# Patient Record
Sex: Female | Born: 1970 | Race: Black or African American | Hispanic: No | Marital: Single | Smoking: Current some day smoker
Health system: Southern US, Community
[De-identification: ages and names within clinical notes are randomized; demographics above are authoritative.]

## PROBLEM LIST (undated history)

## (undated) DIAGNOSIS — R51 Headache: Secondary | ICD-10-CM

## (undated) DIAGNOSIS — G4733 Obstructive sleep apnea (adult) (pediatric): Secondary | ICD-10-CM

## (undated) DIAGNOSIS — I10 Essential (primary) hypertension: Secondary | ICD-10-CM

## (undated) DIAGNOSIS — J45909 Unspecified asthma, uncomplicated: Secondary | ICD-10-CM

## (undated) DIAGNOSIS — M5430 Sciatica, unspecified side: Secondary | ICD-10-CM

## (undated) DIAGNOSIS — Z87442 Personal history of urinary calculi: Secondary | ICD-10-CM

## (undated) DIAGNOSIS — F419 Anxiety disorder, unspecified: Secondary | ICD-10-CM

## (undated) DIAGNOSIS — Z8679 Personal history of other diseases of the circulatory system: Secondary | ICD-10-CM

## (undated) DIAGNOSIS — E559 Vitamin D deficiency, unspecified: Secondary | ICD-10-CM

## (undated) DIAGNOSIS — Z8619 Personal history of other infectious and parasitic diseases: Secondary | ICD-10-CM

## (undated) DIAGNOSIS — E119 Type 2 diabetes mellitus without complications: Secondary | ICD-10-CM

## (undated) DIAGNOSIS — R011 Cardiac murmur, unspecified: Secondary | ICD-10-CM

## (undated) DIAGNOSIS — F329 Major depressive disorder, single episode, unspecified: Secondary | ICD-10-CM

## (undated) DIAGNOSIS — E785 Hyperlipidemia, unspecified: Secondary | ICD-10-CM

## (undated) DIAGNOSIS — F319 Bipolar disorder, unspecified: Secondary | ICD-10-CM

## (undated) DIAGNOSIS — F32A Depression, unspecified: Secondary | ICD-10-CM

## (undated) DIAGNOSIS — G43909 Migraine, unspecified, not intractable, without status migrainosus: Secondary | ICD-10-CM

## (undated) DIAGNOSIS — M1990 Unspecified osteoarthritis, unspecified site: Secondary | ICD-10-CM

## (undated) HISTORY — DX: Personal history of other diseases of the circulatory system: Z86.79

## (undated) HISTORY — DX: Sciatica, unspecified side: M54.30

## (undated) HISTORY — DX: Migraine, unspecified, not intractable, without status migrainosus: G43.909

## (undated) HISTORY — DX: Unspecified osteoarthritis, unspecified site: M19.90

## (undated) HISTORY — DX: Cardiac murmur, unspecified: R01.1

## (undated) HISTORY — DX: Type 2 diabetes mellitus without complications: E11.9

## (undated) HISTORY — DX: Obstructive sleep apnea (adult) (pediatric): G47.33

## (undated) HISTORY — DX: Anxiety disorder, unspecified: F41.9

## (undated) HISTORY — DX: Personal history of other infectious and parasitic diseases: Z86.19

## (undated) HISTORY — DX: Hyperlipidemia, unspecified: E78.5

## (undated) HISTORY — DX: Bipolar disorder, unspecified: F31.9

## (undated) HISTORY — DX: Headache: R51

## (undated) HISTORY — DX: Major depressive disorder, single episode, unspecified: F32.9

## (undated) HISTORY — DX: Personal history of urinary calculi: Z87.442

## (undated) HISTORY — DX: Unspecified asthma, uncomplicated: J45.909

## (undated) HISTORY — DX: Depression, unspecified: F32.A

## (undated) HISTORY — DX: Essential (primary) hypertension: I10

## (undated) HISTORY — DX: Vitamin D deficiency, unspecified: E55.9

---
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
## 1995-07-28 HISTORY — PX: TUBAL LIGATION: SHX77

## 2004-07-27 DIAGNOSIS — E119 Type 2 diabetes mellitus without complications: Secondary | ICD-10-CM

## 2004-07-27 HISTORY — DX: Type 2 diabetes mellitus without complications: E11.9

## 2005-07-27 DIAGNOSIS — Z87442 Personal history of urinary calculi: Secondary | ICD-10-CM

## 2005-07-27 HISTORY — DX: Personal history of urinary calculi: Z87.442

## 2006-07-04 ENCOUNTER — Encounter: Payer: Self-pay | Admitting: Internal Medicine

## 2007-07-28 HISTORY — PX: KNEE SURGERY: SHX244

## 2008-07-27 HISTORY — PX: OTHER SURGICAL HISTORY: SHX169

## 2011-06-08 LAB — LIPID PANEL: HDL: 51 mg/dL (ref 35–70)

## 2011-11-19 ENCOUNTER — Encounter: Payer: Self-pay | Admitting: Family Medicine

## 2011-11-19 ENCOUNTER — Ambulatory Visit (INDEPENDENT_AMBULATORY_CARE_PROVIDER_SITE_OTHER): Payer: Medicare Other | Admitting: Family Medicine

## 2011-11-19 ENCOUNTER — Other Ambulatory Visit: Payer: Self-pay | Admitting: Family Medicine

## 2011-11-19 VITALS — BP 112/80 | HR 72 | Temp 97.9°F | Ht 62.0 in | Wt 176.2 lb

## 2011-11-19 DIAGNOSIS — I10 Essential (primary) hypertension: Secondary | ICD-10-CM

## 2011-11-19 DIAGNOSIS — F319 Bipolar disorder, unspecified: Secondary | ICD-10-CM | POA: Insufficient documentation

## 2011-11-19 DIAGNOSIS — B9689 Other specified bacterial agents as the cause of diseases classified elsewhere: Secondary | ICD-10-CM | POA: Insufficient documentation

## 2011-11-19 DIAGNOSIS — N898 Other specified noninflammatory disorders of vagina: Secondary | ICD-10-CM

## 2011-11-19 DIAGNOSIS — E119 Type 2 diabetes mellitus without complications: Secondary | ICD-10-CM

## 2011-11-19 DIAGNOSIS — G43909 Migraine, unspecified, not intractable, without status migrainosus: Secondary | ICD-10-CM | POA: Insufficient documentation

## 2011-11-19 DIAGNOSIS — E785 Hyperlipidemia, unspecified: Secondary | ICD-10-CM

## 2011-11-19 DIAGNOSIS — J45909 Unspecified asthma, uncomplicated: Secondary | ICD-10-CM | POA: Insufficient documentation

## 2011-11-19 DIAGNOSIS — Z113 Encounter for screening for infections with a predominantly sexual mode of transmission: Secondary | ICD-10-CM

## 2011-11-19 LAB — RPR

## 2011-11-19 NOTE — Progress Notes (Signed)
Subjective:    Patient ID: Allison Schmitt, female    DOB: [birth year], 41 y.o.   MRN: [id number]  HPI CC: new pt establish  Recently moved from [place].  Prior saw Dr. Denton Lank at [facility] family physicians, records pending.  DM - 2006.  Off metformin for the last year and doing well.  Last A1c was ~6.3%.  Doesn't regularly check sugars.  Due for vision exam, last 2010.  Foot exam last visit.  H/o recurrent kidney stones.  Diagnosed bipolar - on celexa and stable with this.  Understands risks of mania but states has so far done very well with only treating depression.  Doesn't like mood stabilizers.  Has been on risperdal, abilify.  On disability from bipolar but able to work part time.  LMP: 11/16/2011 s/p uterine ablation - done for dysmenorrhea 2010. When cycle ends, continues to have brown discharge with strong odor heavier first 3 days then slowly resolving.  This is new, happened for first time this week. With one partner, unprotected for last 2 years.  2 mo ago had normal STD screen.  CT/GC, HIV, RPR.  H/o BV x1.  H/o HSV, chlamydia, gonorrhea.  Caffeine: dark sodas occasionally Lives with daughter, 1 dog Occupation: disability from bipolar, looking for part time job Edu: HS  Preventative: Well woman prior with OBGYN, would like to establish well woman here.   Last CPE 1-2 yrs ago.  Medications and allergies reviewed and updated in chart.  Past histories reviewed and updated if relevant as below. Patient Active Problem List  Diagnoses  . Vaginal Discharge  . Asthma  . Bipolar disorder  . Diabetes mellitus  . HTN (hypertension)  . HLD (hyperlipidemia)  . Migraines   Past Medical History  Diagnosis Date  . Arthritis     bilateral knees  . Asthma   . History of chicken pox   . Bipolar disorder   . Diabetes mellitus 2006    Diet controlled  . Headache   . Heart murmur   . HTN (hypertension)   . HLD (hyperlipidemia)   . History of kidney stones 2007    s/p  lithotripsy, 2 currently, stable  . Migraines     prior on maxalt  . History of rheumatic fever    Past Surgical History  Procedure Date  . Kidney stone surgery 1989    lithotripsy  . Tubal ligation 1997  . Knee surgery 2009    Right  . Uterine ablation 2010    dysmenorrhea and menorrhagia   History  Substance Use Topics  . Smoking status: Never Smoker   . Smokeless tobacco: Never Used  . Alcohol Use: Yes     1 glass wine/day   Family History  Problem Relation Age of Onset  . Bipolar disorder Mother   . Diabetes Mother   . Hyperlipidemia Mother   . Hypertension Mother   . Glaucoma Mother   . Stroke Mother   . Coronary artery disease Mother   . Glaucoma Father   . Thyroid disease Father   . Cancer Paternal Aunt     pancreatic  . Cancer Paternal Grandfather     bone  . Rheum arthritis Mother    Allergies  Allergen Reactions  . Ciprofloxacin Anaphylaxis  . Penicillins Anaphylaxis  . Sulfa Antibiotics Anaphylaxis  . Prednisone Rash   Current Outpatient Prescriptions on File Prior to Visit  Medication Sig Dispense Refill  . citalopram (CELEXA) 40 MG tablet Take 40 mg by mouth daily.      Marland Kitchen  lisinopril (PRINIVIL,ZESTRIL) 40 MG tablet Take 40 mg by mouth daily.      Marland Kitchen albuterol (PROAIR HFA) 108 (90 BASE) MCG/ACT inhaler Inhale 2 puffs into the lungs every 6 (six) hours as needed.      . Fluticasone-Salmeterol (ADVAIR) 100-50 MCG/DOSE AEPB Inhale 1 puff into the lungs every 12 (twelve) hours.      . pravastatin (PRAVACHOL) 20 MG tablet Take 20 mg by mouth daily.         Review of Systems  Constitutional: Negative for fever, chills, activity change, appetite change, fatigue and unexpected weight change.  HENT: Negative for hearing loss and neck pain.   Eyes: Negative for visual disturbance.  Respiratory: Negative for cough, chest tightness, shortness of breath and wheezing.   Cardiovascular: Negative for chest pain, palpitations and leg swelling.  Gastrointestinal:  Negative for nausea, vomiting, abdominal pain, diarrhea, constipation, blood in stool and abdominal distention.  Genitourinary: Positive for vaginal discharge. Negative for hematuria and difficulty urinating.  Musculoskeletal: Negative for myalgias and arthralgias.  Skin: Negative for rash.  Neurological: Positive for headaches (with stress). Negative for dizziness, seizures and syncope.  Hematological: Does not bruise/bleed easily.  Psychiatric/Behavioral: Positive for dysphoric mood. The patient is not nervous/anxious.        Objective:   Physical Exam  Nursing note and vitals reviewed. Constitutional: She is oriented to person, place, and time. She appears well-developed and well-nourished. No distress.  HENT:  Head: Normocephalic and atraumatic.  Right Ear: Hearing, tympanic membrane, external ear and ear canal normal.  Left Ear: Hearing, tympanic membrane, external ear and ear canal normal.  Nose: Nose normal.  Mouth/Throat: Oropharynx is clear and moist. No oropharyngeal exudate.  Eyes: Conjunctivae and EOM are normal. Pupils are equal, round, and reactive to light. No scleral icterus.  Neck: Normal range of motion. Neck supple. No thyromegaly present.  Cardiovascular: Normal rate, regular rhythm, normal heart sounds and intact distal pulses.   No murmur heard. Pulses:      Radial pulses are 2+ on the right side, and 2+ on the left side.  Pulmonary/Chest: Effort normal and breath sounds normal. No respiratory distress. She has no wheezes. She has no rales.  Abdominal: Soft. Bowel sounds are normal. She exhibits no distension and no mass. There is no tenderness. There is no rebound and no guarding.  Genitourinary: Uterus normal. Pelvic exam was performed with patient supine. There is no rash, tenderness, lesion or injury on the right labia. There is no rash, tenderness, lesion or injury on the left labia. Cervix exhibits no motion tenderness and no friability. Right adnexum displays  no mass, no tenderness and no fullness. Left adnexum displays no mass, no tenderness and no fullness. No erythema, tenderness or bleeding around the vagina. No signs of injury around the vagina. Vaginal discharge found.       Anterior cervix  Musculoskeletal: Normal range of motion. She exhibits no edema.  Lymphadenopathy:    She has no cervical adenopathy.  Neurological: She is alert and oriented to person, place, and time.       CN grossly intact, station and gait intact  Skin: Skin is warm and dry. No rash noted.  Psychiatric: She has a normal mood and affect. Her behavior is normal. Judgment and thought content normal.      Assessment & Plan:

## 2011-11-19 NOTE — Patient Instructions (Addendum)
Pelvic today.  I have sent this off for analysis.  We will call you with results. Blood work today. Return in 1-2 months fasting for blood work, afterwards for physical. Good to meet you today, call us with questions.

## 2011-11-20 LAB — HIV ANTIBODY (ROUTINE TESTING W REFLEX): HIV: NONREACTIVE

## 2011-11-20 LAB — GC/CHLAMYDIA PROBE AMP, GENITAL: GC Probe Amp, Genital: NEGATIVE

## 2011-11-21 NOTE — Assessment & Plan Note (Signed)
Dx 2006.  rec set up vision exam.  Check blood work when returns.  Diet controlled.

## 2011-11-21 NOTE — Assessment & Plan Note (Signed)
BP: 112/80 mmHg  Chronic. Stable on lisinopril - continue.

## 2011-11-21 NOTE — Assessment & Plan Note (Signed)
No recent issue. 

## 2011-11-21 NOTE — Assessment & Plan Note (Signed)
Per pt stable, uses both albuterol and advair prn.

## 2011-11-21 NOTE — Assessment & Plan Note (Signed)
Concern for only treating unipolar depression.  Discussed this, however pt feels comfortable with this regimen, will continue.  If any evidence of manic, would start mood stabilizer. Prior saw psych, would like to be followed by pcp, discussed ok as long as stable on current medical regimen.

## 2011-11-21 NOTE — Assessment & Plan Note (Signed)
Stable on pravastatin, will check fasting lipid panel when returns for f/u.

## 2011-11-21 NOTE — Assessment & Plan Note (Signed)
Endorsing several day h/o vaginal discharge. concern for BV - will send of blood work, CT/GC gen probe, and wet prep. S/p uterine ablation. Pap when returns for CPE.

## 2011-11-23 ENCOUNTER — Other Ambulatory Visit: Payer: Self-pay | Admitting: Family Medicine

## 2011-11-23 LAB — WET PREP, GENITAL: Yeast Wet Prep HPF POC: NONE SEEN

## 2011-11-23 MED ORDER — METRONIDAZOLE 500 MG PO TABS: 500.0000 mg | ORAL_TABLET | Freq: Two times a day (BID) | ORAL | Status: AC

## 2011-11-30 ENCOUNTER — Telehealth: Payer: Self-pay

## 2011-11-30 MED ORDER — FLUCONAZOLE 150 MG PO TABS: 150.0000 mg | ORAL_TABLET | Freq: Once | ORAL | Status: AC

## 2011-11-30 NOTE — Telephone Encounter (Signed)
Patient advised.

## 2011-11-30 NOTE — Telephone Encounter (Signed)
plz notify sent in. 

## 2011-11-30 NOTE — Telephone Encounter (Signed)
Pt seen 11/19/11 and given Flagyl which pt said has caused yeast infection. Pt having vaginal discharge with perineal itching and irritation. Pt request diflucan sent to [facility]. Pt can be reached [phone].

## 2011-12-22 ENCOUNTER — Telehealth: Payer: Self-pay | Admitting: Family Medicine

## 2011-12-22 NOTE — Telephone Encounter (Signed)
Noted, thanks!

## 2011-12-22 NOTE — Telephone Encounter (Signed)
Caller: [facility]/Patient; PCP: Eustaquio Boyden; CB#: [phone];  Call regarding Headache X 2 weeks; Denies pregnancy.  Rates pain at 5 of 10.  Emergent sx ruled out.  Home care and follow up with provider in 24 hours per Headache protocol. She has appt. on 5/29 @ 0900; PCP had cancellation at 1400 on 5/28 but caller unable to come at that time.

## 2011-12-23 ENCOUNTER — Ambulatory Visit (INDEPENDENT_AMBULATORY_CARE_PROVIDER_SITE_OTHER): Payer: Medicare Other | Admitting: Family Medicine

## 2011-12-23 ENCOUNTER — Encounter: Payer: Self-pay | Admitting: Family Medicine

## 2011-12-23 VITALS — BP 130/90 | HR 76 | Temp 98.6°F | Wt 177.2 lb

## 2011-12-23 DIAGNOSIS — S161XXA Strain of muscle, fascia and tendon at neck level, initial encounter: Secondary | ICD-10-CM

## 2011-12-23 DIAGNOSIS — S139XXA Sprain of joints and ligaments of unspecified parts of neck, initial encounter: Secondary | ICD-10-CM

## 2011-12-23 MED ORDER — LISINOPRIL 40 MG PO TABS: 40.0000 mg | ORAL_TABLET | Freq: Every day | ORAL | Status: DC

## 2011-12-23 MED ORDER — CYCLOBENZAPRINE HCL 10 MG PO TABS: 10.0000 mg | ORAL_TABLET | Freq: Two times a day (BID) | ORAL | Status: AC | PRN

## 2011-12-23 MED ORDER — CITALOPRAM HYDROBROMIDE 40 MG PO TABS: 40.0000 mg | ORAL_TABLET | Freq: Every day | ORAL | Status: DC

## 2011-12-23 MED ORDER — NAPROXEN 500 MG PO TABS: ORAL_TABLET | ORAL | Status: DC

## 2011-12-23 NOTE — Progress Notes (Signed)
  Subjective:    Patient ID: Allison Schmitt, female    DOB: 07-01-1971, 41 y.o.   MRN: [id number]  HPI CC: HA, neck pain  For 2 wks noticing pressure at base of skull on right side.  Worse pain with moving neck.  Thought sinuses?  No shooting pain down arm.  No numbness/weakness.  No fevers/chills.    So far has tried [employer] which didn't help.  Denies inciting trauma or injury  Several years back had been told has pinched nerve in past and bulging disk but unsure what level this was at.    Thinks needs new mattress.  Sleeps with right arm elevated.  bp elevated today but out of lisinopril for last few days - which were refilled today.  Would like celexa refilled as well.  Review of Systems Per HPI    Objective:   Physical Exam  Nursing note and vitals reviewed. Constitutional: She appears well-developed and well-nourished. No distress.  Neck: Normal range of motion. Neck supple.  Musculoskeletal:       FROM at neck and shoulders. Neg spurling. Mild midline tenderness lower cervical region, no splenius spasm appreciated.  Tender at insertion of right splenius into occiput.  Neurological: She has normal strength. No cranial nerve deficit or sensory deficit. She exhibits normal muscle tone.       Assessment & Plan:

## 2011-12-23 NOTE — Patient Instructions (Signed)
I think you have cervical neck sprain. Treat with anti inflammatory and muscle relaxant. Also use ice/heat to neck - whichever soothes better. stretching exercises provided today as well. Update [place] if symptoms not better after this.

## 2011-12-23 NOTE — Assessment & Plan Note (Signed)
Anticipate cevical strain - treat supportively as per instructions. Stretching exercises from [facility] pt advisor provided today. Update if worsening or not improving as expected.

## 2012-01-13 ENCOUNTER — Other Ambulatory Visit: Payer: Self-pay | Admitting: Family Medicine

## 2012-01-13 DIAGNOSIS — E785 Hyperlipidemia, unspecified: Secondary | ICD-10-CM

## 2012-01-13 DIAGNOSIS — E119 Type 2 diabetes mellitus without complications: Secondary | ICD-10-CM

## 2012-01-13 DIAGNOSIS — I10 Essential (primary) hypertension: Secondary | ICD-10-CM

## 2012-01-18 ENCOUNTER — Other Ambulatory Visit (INDEPENDENT_AMBULATORY_CARE_PROVIDER_SITE_OTHER): Payer: Medicare Other

## 2012-01-18 DIAGNOSIS — I10 Essential (primary) hypertension: Secondary | ICD-10-CM

## 2012-01-18 DIAGNOSIS — E119 Type 2 diabetes mellitus without complications: Secondary | ICD-10-CM

## 2012-01-18 DIAGNOSIS — E785 Hyperlipidemia, unspecified: Secondary | ICD-10-CM

## 2012-01-18 LAB — COMPREHENSIVE METABOLIC PANEL
ALT: 13 U/L (ref 0–35)
CO2: 23 mEq/L (ref 19–32)
Calcium: 9.4 mg/dL (ref 8.4–10.5)
Chloride: 107 mEq/L (ref 96–112)
GFR: 79.96 mL/min (ref >60.00)
Sodium: 139 mEq/L (ref 135–145)
Total Protein: 7.6 g/dL (ref 6.0–8.3)

## 2012-01-18 LAB — MICROALBUMIN / CREATININE URINE RATIO
Creatinine,U: 192.5 mg/dL
Microalb, Ur: 1.6 mg/dL (ref 0.0–1.9)

## 2012-01-18 LAB — CBC WITH DIFFERENTIAL/PLATELET
Basophils Absolute: 0 10*3/uL (ref 0.0–0.1)
Eosinophils Absolute: 0.1 10*3/uL (ref 0.0–0.7)
Lymphocytes Relative: 42.6 % (ref 12.0–46.0)
MCHC: 32.8 g/dL (ref 30.0–36.0)
Neutrophils Relative %: 49.6 % (ref 43.0–77.0)
Platelets: 257 10*3/uL (ref 150.0–400.0)
RDW: 14.8 % — ABNORMAL HIGH (ref 11.5–14.6)

## 2012-01-18 LAB — LIPID PANEL
Cholesterol: 183 mg/dL (ref 0–200)
Total CHOL/HDL Ratio: 4

## 2012-01-18 LAB — HEMOGLOBIN A1C: Hgb A1c MFr Bld: 6.4 % (ref 4.6–6.5)

## 2012-01-22 ENCOUNTER — Ambulatory Visit (INDEPENDENT_AMBULATORY_CARE_PROVIDER_SITE_OTHER): Payer: Medicare Other | Admitting: Family Medicine

## 2012-01-22 ENCOUNTER — Encounter: Payer: Self-pay | Admitting: Family Medicine

## 2012-01-22 ENCOUNTER — Other Ambulatory Visit (HOSPITAL_COMMUNITY)
Admission: RE | Admit: 2012-01-22 | Discharge: 2012-01-22 | Disposition: A | Discharge status: Home / Self Care | Payer: Medicare Other | Source: Ambulatory Visit | Attending: Family Medicine | Admitting: Family Medicine

## 2012-01-22 VITALS — BP 136/93 | HR 80 | Temp 98.6°F | Ht 62.0 in | Wt 178.8 lb

## 2012-01-22 DIAGNOSIS — Z0000 Encounter for general adult medical examination without abnormal findings: Secondary | ICD-10-CM

## 2012-01-22 DIAGNOSIS — E559 Vitamin D deficiency, unspecified: Secondary | ICD-10-CM | POA: Insufficient documentation

## 2012-01-22 DIAGNOSIS — Z01419 Encounter for gynecological examination (general) (routine) without abnormal findings: Secondary | ICD-10-CM | POA: Insufficient documentation

## 2012-01-22 MED ORDER — CYCLOBENZAPRINE HCL 5 MG PO TABS: 5.0000 mg | ORAL_TABLET | Freq: Two times a day (BID) | ORAL | Status: AC | PRN

## 2012-01-22 NOTE — Patient Instructions (Addendum)
Good to see you today, call us with questions Breast and pelvic exam with pap smear today. We will discuss starting aspirin next year. We will likely start mammograms next year. Remember to take cholesterol medicine daily.

## 2012-01-22 NOTE — Assessment & Plan Note (Signed)
Preventative protocols reviewed and updated unless pt declined. Discussed healthy diet and lifestyle. Breast and pelvic exam today with pap. RTC 1 yr or prn.

## 2012-01-22 NOTE — Addendum Note (Signed)
Addended byWilley Blade on: 01/22/2012 12:35 PM   Modules accepted: Orders

## 2012-01-22 NOTE — Progress Notes (Signed)
Subjective:    Patient ID: Allison Schmitt, female    DOB: 1971-03-01, 41 y.o.   MRN: [id number]  HPI CC: annual exam  No concerns today. Diet controlled diabetes.  Preventative: Well woman prior with OBGYN, would like to establish well woman here.  Reviewed blood work in detail. No recent pap smear in last few years. Breast exam today.  Never had mammogram. Has received tetanus, thinks 2011 Has received pneumonia shot recently.  Caffeine: has cut down on sodas Lives with daughter, 1 dog Occupation: disability from bipolar, looking for part time job Edu: HS Activity: walking - not recently, some cardio at home Diet: good water, occasional fruits/vegetables  Wt Readings from Last 3 Encounters:  01/22/12 178 lb 12 oz (81.08 kg)  12/23/11 177 lb 4 oz (80.4 kg)  11/19/11 176 lb 4 oz (79.946 kg)    Medications and allergies reviewed and updated in chart.  Past histories reviewed and updated if relevant as below. Patient Active Problem List  Diagnosis  . Vaginal Discharge  . Asthma  . Bipolar disorder  . Diabetes mellitus  . HTN (hypertension)  . HLD (hyperlipidemia)  . Migraines  . Neck strain  . Healthcare maintenance   Past Medical History  Diagnosis Date  . Arthritis     bilateral knees  . Asthma   . History of chicken pox   . Bipolar disorder   . Diabetes mellitus 2006    Diet controlled  . Headache   . Heart murmur   . HTN (hypertension)   . HLD (hyperlipidemia)   . History of kidney stones 2007    s/p lithotripsy, 2 currently, stable  . Migraines     prior on maxalt  . History of rheumatic fever    Past Surgical History  Procedure Date  . Kidney stone surgery 1989    lithotripsy  . Tubal ligation 1997  . Knee surgery 2009    Right  . Uterine ablation 2010    dysmenorrhea and menorrhagia   History  Substance Use Topics  . Smoking status: Former Smoker    Types: Cigarettes  . Smokeless tobacco: Never Used  . Alcohol Use: Yes     1 glass  wine/day   Family History  Problem Relation Age of Onset  . Bipolar disorder Mother   . Diabetes Mother   . Hyperlipidemia Mother   . Hypertension Mother   . Glaucoma Mother   . Stroke Mother   . Coronary artery disease Mother   . Glaucoma Father   . Thyroid disease Father   . Cancer Paternal Aunt     pancreatic  . Cancer Paternal Grandfather     bone  . Rheum arthritis Mother   . Aneurysm Sister 46    brain, deceased   Allergies  Allergen Reactions  . Ciprofloxacin Anaphylaxis  . Penicillins Anaphylaxis  . Sulfa Antibiotics Anaphylaxis  . Prednisone Rash   Current Outpatient Prescriptions on File Prior to Visit  Medication Sig Dispense Refill  . albuterol (PROAIR HFA) 108 (90 BASE) MCG/ACT inhaler Inhale 2 puffs into the lungs every 6 (six) hours as needed.      . citalopram (CELEXA) 40 MG tablet Take 1 tablet (40 mg total) by mouth daily.  90 tablet  3  . Fluticasone-Salmeterol (ADVAIR) 100-50 MCG/DOSE AEPB Inhale 1 puff into the lungs every 12 (twelve) hours.      Marland Kitchen lisinopril (PRINIVIL,ZESTRIL) 40 MG tablet Take 1 tablet (40 mg total) by mouth daily.  90 tablet  3  . naproxen (NAPROSYN) 500 MG tablet Take one po bid x 1 week then prn pain, take with food  40 tablet  0  . cholecalciferol (VITAMIN D) 1000 UNITS tablet Take 2,000 Units by mouth daily.      . pravastatin (PRAVACHOL) 20 MG tablet Take 20 mg by mouth daily.        Review of Systems  Constitutional: Negative for fever, chills, activity change, appetite change, fatigue and unexpected weight change.  HENT: Positive for congestion. Negative for hearing loss and neck pain.   Eyes: Negative for visual disturbance.  Respiratory: Positive for cough and chest tightness. Negative for shortness of breath and wheezing.   Cardiovascular: Negative for chest pain, palpitations and leg swelling.  Gastrointestinal: Negative for nausea, vomiting, abdominal pain, diarrhea, constipation, blood in stool and abdominal  distention.  Genitourinary: Negative for hematuria and difficulty urinating.  Musculoskeletal: Negative for myalgias and arthralgias.  Skin: Negative for rash.  Neurological: Negative for dizziness, seizures, syncope and headaches.  Hematological: Does not bruise/bleed easily.  Psychiatric/Behavioral: Negative for dysphoric mood. The patient is not nervous/anxious.        Objective:   Physical Exam  Nursing note and vitals reviewed. Constitutional: She is oriented to person, place, and time. She appears well-developed and well-nourished. No distress.  HENT:  Head: Normocephalic and atraumatic.  Right Ear: External ear normal.  Left Ear: External ear normal.  Nose: Nose normal.  Mouth/Throat: Oropharynx is clear and moist. No oropharyngeal exudate.  Eyes: Conjunctivae and EOM are normal. Pupils are equal, round, and reactive to light. No scleral icterus.  Neck: Normal range of motion. Neck supple. No thyromegaly present.  Cardiovascular: Normal rate, regular rhythm, normal heart sounds and intact distal pulses.   No murmur heard. Pulses:      Radial pulses are 2+ on the right side, and 2+ on the left side.  Pulmonary/Chest: Effort normal and breath sounds normal. No respiratory distress. She has no wheezes. She has no rales. Right breast exhibits no inverted nipple, no mass, no nipple discharge, no skin change and no tenderness. Left breast exhibits no inverted nipple, no mass, no nipple discharge, no skin change and no tenderness.  Abdominal: Soft. Bowel sounds are normal. She exhibits no distension and no mass. There is no tenderness. There is no rebound and no guarding.  Genitourinary: Vagina normal. Pelvic exam was performed with patient supine. There is no rash, tenderness or lesion on the right labia. There is no rash, tenderness or lesion on the left labia. Uterus is not deviated, not enlarged, not fixed and not tender. Cervix exhibits no motion tenderness, no discharge and no  friability. Right adnexum displays no mass, no tenderness and no fullness. Left adnexum displays no mass, no tenderness and no fullness. No erythema, tenderness or bleeding around the vagina. No foreign body around the vagina. No signs of injury around the vagina. No vaginal discharge found.       Anterior uterus and cervix  Musculoskeletal: Normal range of motion. She exhibits no edema.  Lymphadenopathy:    She has no cervical adenopathy.    She has no axillary adenopathy.       Right axillary: No lateral adenopathy present.       Left axillary: No lateral adenopathy present.      Right: No supraclavicular adenopathy present.       Left: No supraclavicular adenopathy present.  Neurological: She is alert and oriented to person, place, and time.  CN grossly intact, station and gait intact  Skin: Skin is warm and dry. No rash noted.  Psychiatric: She has a normal mood and affect. Her behavior is normal. Judgment and thought content normal.       Assessment & Plan:

## 2012-01-25 ENCOUNTER — Other Ambulatory Visit: Payer: Self-pay | Admitting: *Deleted

## 2012-01-25 MED ORDER — PRAVASTATIN SODIUM 20 MG PO TABS: 20.0000 mg | ORAL_TABLET | Freq: Every day | ORAL | Status: DC

## 2012-01-25 NOTE — Telephone Encounter (Signed)
Patient called stating that she was in last week and forgot to get a refill on her Pravastatin. Refill sent to pharmacy electronically per patient's request.

## 2012-01-27 ENCOUNTER — Encounter: Payer: Self-pay | Admitting: *Deleted

## 2012-02-06 ENCOUNTER — Emergency Department (HOSPITAL_COMMUNITY)
Admission: EM | Admit: 2012-02-06 | Discharge: 2012-02-06 | Disposition: A | Discharge status: Home / Self Care | Payer: Medicare Other | Source: Home / Self Care | Attending: Emergency Medicine | Admitting: Emergency Medicine

## 2012-02-06 ENCOUNTER — Encounter (HOSPITAL_COMMUNITY): Payer: Self-pay | Admitting: Emergency Medicine

## 2012-02-06 DIAGNOSIS — N898 Other specified noninflammatory disorders of vagina: Secondary | ICD-10-CM

## 2012-02-06 DIAGNOSIS — Z113 Encounter for screening for infections with a predominantly sexual mode of transmission: Secondary | ICD-10-CM

## 2012-02-06 LAB — POCT URINALYSIS DIP (DEVICE)
Bilirubin Urine: NEGATIVE
Glucose, UA: NEGATIVE mg/dL
Specific Gravity, Urine: 1.015 (ref 1.005–1.030)
Urobilinogen, UA: 1 mg/dL (ref 0.0–1.0)

## 2012-02-06 LAB — POCT PREGNANCY, URINE: Preg Test, Ur: NEGATIVE

## 2012-02-06 LAB — WET PREP, GENITAL
Trich, Wet Prep: NONE SEEN
Yeast Wet Prep HPF POC: NONE SEEN

## 2012-02-06 MED ORDER — FLUCONAZOLE 200 MG PO TABS: 200.0000 mg | ORAL_TABLET | Freq: Every day | ORAL | Status: AC

## 2012-02-06 MED ORDER — METRONIDAZOLE 500 MG PO TABS: 500.0000 mg | ORAL_TABLET | Freq: Two times a day (BID) | ORAL | Status: DC

## 2012-02-06 NOTE — ED Notes (Signed)
Reports vaginal discharge and odor.  Reports history of BV and believes this is what is going on now.  Denies painful urination.  Reports bladder feels "full" but not going alot

## 2012-02-06 NOTE — ED Notes (Signed)
Instructed to get undress and place on gown for physician exam.  Equipment at bedside

## 2012-02-06 NOTE — ED Notes (Signed)
Patient obtained urine specimen.   

## 2012-02-06 NOTE — ED Provider Notes (Signed)
History     CSN: [id number]  Arrival date & time 02/06/12  1706   First MD Initiated Contact with Patient 02/06/12 1710      Chief Complaint  Patient presents with  . Vaginal Discharge    (Consider location/radiation/quality/duration/timing/severity/associated sxs/prior treatment) Patient is a 41 y.o. female presenting with vaginal discharge.  Vaginal Discharge This is a new problem. The current episode started more than 1 week ago. The problem occurs constantly. The problem has not changed since onset.Pertinent negatives include no abdominal pain, no headaches and no shortness of breath. Nothing aggravates the symptoms. Nothing relieves the symptoms. She has tried nothing for the symptoms.    Past Medical History  Diagnosis Date  . Arthritis     bilateral knees  . Asthma   . History of chicken pox   . Bipolar disorder   . Diabetes mellitus 2006    Diet controlled  . Headache   . Heart murmur   . HTN (hypertension)   . HLD (hyperlipidemia)   . History of kidney stones 2007    s/p lithotripsy, 2 currently, stable  . Migraines     prior on maxalt  . History of rheumatic fever   . Vitamin d deficiency     Past Surgical History  Procedure Date  . Kidney stone surgery 1989    lithotripsy  . Tubal ligation 1997  . Knee surgery 2009    Right  . Uterine ablation 2010    dysmenorrhea and menorrhagia    Family History  Problem Relation Age of Onset  . Bipolar disorder Mother   . Diabetes Mother   . Hyperlipidemia Mother   . Hypertension Mother   . Glaucoma Mother   . Stroke Mother   . Coronary artery disease Mother   . Glaucoma Father   . Thyroid disease Father   . Cancer Paternal Aunt     pancreatic  . Cancer Paternal Grandfather     bone  . Rheum arthritis Mother   . Aneurysm Sister 59    brain, deceased    History  Substance Use Topics  . Smoking status: Former Smoker    Types: Cigarettes  . Smokeless tobacco: Never Used  . Alcohol Use: Yes     1  glass wine/day    OB History    Grav Para Term Preterm Abortions TAB SAB Ect Mult Living                  Review of Systems  Constitutional: Negative for chills, activity change and appetite change.  Respiratory: Negative for shortness of breath.   Gastrointestinal: Negative for abdominal pain.  Genitourinary: Positive for vaginal discharge. Negative for dysuria, urgency, decreased urine volume, vaginal bleeding, difficulty urinating, genital sores, vaginal pain and pelvic pain.  Skin: Negative for rash and wound.  Neurological: Negative for dizziness and headaches.    Allergies  Ciprofloxacin; Penicillins; Sulfa antibiotics; and Prednisone  Home Medications   Current Outpatient Rx  Name Route Sig Dispense Refill  . ALBUTEROL SULFATE HFA 108 (90 BASE) MCG/ACT IN AERS Inhalation Inhale 2 puffs into the lungs every 6 (six) hours as needed.    Marland Kitchen VITAMIN D 1000 UNITS PO TABS Oral Take 2,000 Units by mouth daily.    Marland Kitchen CITALOPRAM HYDROBROMIDE 40 MG PO TABS Oral Take 1 tablet (40 mg total) by mouth daily. 90 tablet 3  . FLUCONAZOLE 200 MG PO TABS Oral Take 1 tablet (200 mg total) by mouth daily. 1 tablet 0  .  FLUTICASONE-SALMETEROL 100-50 MCG/DOSE IN AEPB Inhalation Inhale 1 puff into the lungs every 12 (twelve) hours.    Marland Kitchen LISINOPRIL 40 MG PO TABS Oral Take 1 tablet (40 mg total) by mouth daily. 90 tablet 3  . METRONIDAZOLE 500 MG PO TABS Oral Take 1 tablet (500 mg total) by mouth 2 (two) times daily. 14 tablet 0  . NAPROXEN 500 MG PO TABS  Take one po bid x 1 week then prn pain, take with food 40 tablet 0  . PRAVASTATIN SODIUM 20 MG PO TABS Oral Take 1 tablet (20 mg total) by mouth daily. 90 tablet 3    BP 122/63  Pulse 101  Temp 99 F (37.2 C) (Oral)  Resp 18  SpO2 97%  LMP 01/21/2012  Physical Exam  Vitals reviewed. Constitutional: She appears well-developed and well-nourished.  Genitourinary: Cervix exhibits discharge. No erythema, tenderness or bleeding around the  vagina. No foreign body around the vagina. No signs of injury around the vagina. Vaginal discharge found.       White homogeneous discharge with fishy or  Skin: Skin is warm. No rash noted. No erythema.    ED Course  Procedures (including critical care time)  Labs Reviewed  POCT URINALYSIS DIP (DEVICE) - Abnormal; Notable for the following:    Leukocytes, UA SMALL (*)  Biochemical Testing Only. Please order routine urinalysis from main lab if confirmatory testing is needed.   All other components within normal limits  WET PREP, GENITAL - Abnormal; Notable for the following:    Clue Cells Wet Prep HPF POC MANY (*)     All other components within normal limits  POCT PREGNANCY, URINE  GC/CHLAMYDIA PROBE AMP, GENITAL   No results found.   1. Vaginal Discharge       MDM  Vaginal discharge-patient agree to treat this is bacterial vaginosis empirically until wet prep results available.Rx of flagyl and diflucan.        Jimmie Molly, MD 02/06/12 2013

## 2012-02-08 ENCOUNTER — Telehealth: Payer: Self-pay | Admitting: Family Medicine

## 2012-02-08 LAB — GC/CHLAMYDIA PROBE AMP, GENITAL: Chlamydia, DNA Probe: NEGATIVE

## 2012-02-08 NOTE — Telephone Encounter (Signed)
Triage Record Num: [id number] Operator: Geanie Berlin Patient Name: Allison Schmitt Call Date & Time: 02/06/2012 11:34:36AM Patient Phone: [phone] PCP: Eustaquio Boyden Patient Gender: Female PCP Fax : [phone] Patient DOB: 04/26/1971 [facility] Name[facility] Reason for Call: Caller: Alaney/Patient; PCP: Eustaquio Boyden; CB#: [phone]; Call regarding Bacterial Vaginosis; Onset: 02/05/12. Afebrile. LMP 01/20/12. BTL/Condoms. Reports pelvic cramping, white/creamy discharge, vaginal itching and strong odor. Urinary frequency and urgency present since 02/03/12. Advised to see [facility] within 24 hrs for one or more urinary tract symptoms and not previously evaluated per Vaginal Discharge or Irritation Guideline. Protocol(s) Used: Vaginal Discharge or Irritation Recommended Outcome per Protocol: See Provider within 24 hours Reason for Outcome: Has one or more urinary tract symptoms AND has not been previously evaluated Care Advice: ~ Tell provider medical history of renal disease; especially if have only one kidney. Increase intake of fluids. Try to drink 8 oz. (.2 liter) every hour when awake, including unsweetened cranberry juice, unless on restricted fluids for other medical reasons. Take sips of fluid or eat ice chips if nauseated or vomiting. ~ ~ SYMPTOM / CONDITION MANAGEMENT Systemic Inflammatory Response Syndrome (SIRS): Watch for signs of a generalized, whole body infection. Occurs within days of a localized infection, especially of the urinary, GI, respiratory or nervous systems; or after a traumatic injury or invasive procedure. - Call EMS 911 if symptoms have worsened, such as increasing confusion or unusual drowsiness; cold and clammy skin; no urine output; rapid respiration (>30/min.) or slow respiration (<10/min.); struggling to breathe. - Go to the ED immediately for early symptoms of rapid pulse >90/min. or rapid breathing >20/min. at rest; chills;  oral temperature >100.4 F (38 C) or <96.8 F (36 C) when associated with conditions noted. ~ 02/06/2012 11:51:19AM Page 1 of 1 [email or web address]_[email or web address]

## 2012-02-08 NOTE — ED Notes (Signed)
GC/Chlamydia neg., Wet prep: Many clue cells.  Pt. adequately treated with Flagyl. Vassie Moselle 02/08/2012

## 2012-02-08 NOTE — Telephone Encounter (Signed)
Noted  

## 2012-02-24 ENCOUNTER — Encounter: Payer: Self-pay | Admitting: Family Medicine

## 2012-02-24 ENCOUNTER — Ambulatory Visit (INDEPENDENT_AMBULATORY_CARE_PROVIDER_SITE_OTHER): Payer: Medicare Other | Admitting: Family Medicine

## 2012-02-24 VITALS — BP 104/64 | HR 88 | Temp 98.1°F | Wt 171.8 lb

## 2012-02-24 DIAGNOSIS — R350 Frequency of micturition: Secondary | ICD-10-CM

## 2012-02-24 DIAGNOSIS — N390 Urinary tract infection, site not specified: Secondary | ICD-10-CM

## 2012-02-24 LAB — POCT URINALYSIS DIPSTICK
Blood, UA: NEGATIVE
Glucose, UA: NEGATIVE
Nitrite, UA: NEGATIVE

## 2012-02-24 MED ORDER — CEPHALEXIN 500 MG PO CAPS: 500.0000 mg | ORAL_CAPSULE | Freq: Two times a day (BID) | ORAL | Status: DC

## 2012-02-24 NOTE — Progress Notes (Signed)
  Subjective:    Patient ID: Allison Schmitt, female    DOB: 18-Jan-1971, 41 y.o.   MRN: [id number]  HPI CC: ?UTI  3d ago started having sweats and chills, very achey joints.  Urinary frequency along with some hesitancy and urgency.  Better from this standpoint, however today with soreness in stomach.  Also lower back with throbbing ache.  No dysuria, blood in urine.  No nausea/vomiting.  No more vaginal discharge.   Last month went to [facility] for bacterial vaginosis.  Treated with metronidazole and then diflucan.  Better from that standpoint.  Denies ST, cough, congestion, sneezing, ST.  Does not feel like prior kidney stones.  has been drinking more water recently.  Last stone removed 2007 2/2 obstruction.  Lab Results  Component Value Date   CREATININE 0.8 01/18/2012     Review of Systems Per HPI    Objective:   Physical Exam  Nursing note and vitals reviewed. Constitutional: She appears well-developed and well-nourished. No distress.  Abdominal: Soft. Bowel sounds are normal. She exhibits no distension and no mass. There is no hepatosplenomegaly. There is tenderness (mild) in the right upper quadrant and suprapubic area. There is no rebound, no guarding and no CVA tenderness.  Skin: Skin is warm and dry. No rash noted. No pallor.       Assessment & Plan:

## 2012-02-24 NOTE — Assessment & Plan Note (Addendum)
UA/micro consistent with UTI. Will treat as such with keflex (states has taken this abx well despite penicillin allergy). Per pt anaphylaxis to several other abx in past. If not improving as expected, return for further evaluation. Given chills and sweats, will treat with 10 d course of keflex.

## 2012-02-24 NOTE — Patient Instructions (Addendum)
I am worried about urinary infection, treat with 10 days of antibiotics (twice daily keflex).  Finish full course. Drink plenty of water and some cranberry juice (but watch sugar content). I don't think this is due to kidney stone, but that is a possibility so if not improving over next several days, or any worsening, please return to be seen or seek urgent care. Good to see you today, call us with questions.

## 2012-02-27 LAB — URINE CULTURE: Colony Count: >=100000

## 2012-02-28 ENCOUNTER — Encounter: Payer: Self-pay | Admitting: Family Medicine

## 2012-02-28 ENCOUNTER — Other Ambulatory Visit: Payer: Self-pay | Admitting: Family Medicine

## 2012-02-28 MED ORDER — DOXYCYCLINE HYCLATE 100 MG PO CAPS: 100.0000 mg | ORAL_CAPSULE | Freq: Two times a day (BID) | ORAL | Status: DC

## 2012-03-08 ENCOUNTER — Telehealth: Payer: Self-pay

## 2012-03-08 MED ORDER — FLUCONAZOLE 150 MG PO TABS: 150.0000 mg | ORAL_TABLET | Freq: Once | ORAL | Status: AC

## 2012-03-08 NOTE — Telephone Encounter (Signed)
Pt recently took antibiotic; now pt has white vaginal discharge with vaginal and perineal itching. Pt request diflucan to [facility].Please advise.

## 2012-03-08 NOTE — Telephone Encounter (Signed)
Patient notified

## 2012-03-08 NOTE — Telephone Encounter (Signed)
plz notify sent in. 

## 2012-04-15 ENCOUNTER — Ambulatory Visit: Payer: Medicare Other | Admitting: Family Medicine

## 2012-04-17 ENCOUNTER — Encounter: Payer: Self-pay | Admitting: Family Medicine

## 2012-04-19 ENCOUNTER — Encounter: Payer: Self-pay | Admitting: Family Medicine

## 2012-04-19 ENCOUNTER — Ambulatory Visit (INDEPENDENT_AMBULATORY_CARE_PROVIDER_SITE_OTHER): Payer: Medicare Other | Admitting: Family Medicine

## 2012-04-19 VITALS — BP 110/76 | HR 75 | Temp 98.4°F | Resp 20 | Ht 62.0 in | Wt 174.8 lb

## 2012-04-19 DIAGNOSIS — F411 Generalized anxiety disorder: Secondary | ICD-10-CM

## 2012-04-19 DIAGNOSIS — F419 Anxiety disorder, unspecified: Secondary | ICD-10-CM

## 2012-04-19 DIAGNOSIS — Z23 Encounter for immunization: Secondary | ICD-10-CM

## 2012-04-19 DIAGNOSIS — N898 Other specified noninflammatory disorders of vagina: Secondary | ICD-10-CM

## 2012-04-19 MED ORDER — METRONIDAZOLE 500 MG PO TABS: 500.0000 mg | ORAL_TABLET | Freq: Two times a day (BID) | ORAL | Status: DC

## 2012-04-19 MED ORDER — FLUCONAZOLE 150 MG PO TABS: 150.0000 mg | ORAL_TABLET | Freq: Once | ORAL | Status: DC

## 2012-04-19 NOTE — Assessment & Plan Note (Signed)
Grossly bloody sample, wet prep not done. H/o BV and candidal vaginitis in past, will treat empirically with flagyl x 7 days.  Sent in diflucan to use in case develops further sxs of yeast infection. Pt agrees with plan, aware will need to return for re exam if sxs continued or worsened.

## 2012-04-19 NOTE — Progress Notes (Signed)
  Subjective:    Patient ID: Allison Schmitt, female    DOB: September 07, 1970, 41 y.o.   MRN: [id number]  HPI CC: vag discharge, anxiety  LMP 04/18/2012, currently on cycle.  Prefers not to have pelvic exam today. Last month treated for yeast infection with diflucan.  Prior to this seen at [facility] with dx BV - treated with diflucan and flagyl as well.  Has not tried monistat.  Thinks allergic to condoms as seems to have infection every time uses condoms.  Seems to have BV infection after every sexual encounter with condoms.  Vag discharge started 2 wks ago, odor getting worse.  No fevers/chills, back pain, abd pain, dysuria.  Anxiety - having worsening anxiety over last several months.  Stressing about bills, finances.   22yo nephew recently came to live with her.  Lives in 2 bedroom.  Gets shakey, teary, heart races.  Last week happened at work.  Walks daily - about 1 hour daily.  For last 1-2 wks hasn't done any walking.  Prior on xanax.  No current psychiatrist.  I had agreed to continue writing for celexa as long as stable.  If worsening sxs, will need to re establish with psych.  Past Medical History  Diagnosis Date  . Arthritis     bilateral knees  . Asthma   . History of chicken pox   . Bipolar disorder   . Type 2 diabetes mellitus 2006    Diet controlled currently, prior on metformin  . Headache   . HTN (hypertension)   . HLD (hyperlipidemia)   . History of kidney stones 2007    s/p lithotripsy, 2 currently, stable, has seen uro in past  . Migraines     prior on maxalt  . History of rheumatic fever     with residual murmur  . Vitamin d deficiency   . OSA (obstructive sleep apnea)     hx this, was on CPAP     Review of Systems Per HPI    Objective:   Physical Exam  Nursing note and vitals reviewed. Constitutional: She appears well-developed and well-nourished. No distress.  Psychiatric: She has a normal mood and affect. Her behavior is normal. Thought content normal.   Pt  self-collected specimen for wet prep - grossly bloody so wet prep deferred.    Assessment & Plan:

## 2012-04-19 NOTE — Assessment & Plan Note (Signed)
Recently worsened 2/2 stress.  Has stopped healthy stress relieving strategies in last 2 wks, correlates with worsened anxiety issues. Recommended restart exercising, update me if sxs not resolved with this.

## 2012-04-19 NOTE — Patient Instructions (Signed)
Flu shot today. Treat as BV with metronidazole (flagyl).  Sent in script for diflucan in case yeast infection develops afterwards. Please return if not improved with this.

## 2012-04-22 ENCOUNTER — Encounter: Payer: Self-pay | Admitting: Family Medicine

## 2012-06-03 ENCOUNTER — Encounter: Payer: Self-pay | Admitting: Family Medicine

## 2012-06-03 ENCOUNTER — Ambulatory Visit (INDEPENDENT_AMBULATORY_CARE_PROVIDER_SITE_OTHER): Payer: Medicare Other | Admitting: Family Medicine

## 2012-06-03 VITALS — BP 106/74 | HR 74 | Temp 98.5°F | Wt 175.0 lb

## 2012-06-03 DIAGNOSIS — N898 Other specified noninflammatory disorders of vagina: Secondary | ICD-10-CM

## 2012-06-03 DIAGNOSIS — Z202 Contact with and (suspected) exposure to infections with a predominantly sexual mode of transmission: Secondary | ICD-10-CM

## 2012-06-03 DIAGNOSIS — Z2089 Contact with and (suspected) exposure to other communicable diseases: Secondary | ICD-10-CM

## 2012-06-03 MED ORDER — METRONIDAZOLE 500 MG PO TABS: 500.0000 mg | ORAL_TABLET | Freq: Two times a day (BID) | ORAL | Status: DC

## 2012-06-03 MED ORDER — FLUCONAZOLE 150 MG PO TABS: 150.0000 mg | ORAL_TABLET | Freq: Once | ORAL | Status: DC

## 2012-06-03 NOTE — Assessment & Plan Note (Signed)
No CMT.  Wet prev with many clue cells, no trich, no yeast.  Treat for BV.  Can use diflucan if she gets white discharge after using flagyl.  Check GC/chlam, HIV/RPR today.  She agrees.

## 2012-06-03 NOTE — Progress Notes (Signed)
Was treated for BV at last OV.   Discharge resolved.  Was feeling well.    Recently with unprotected sex, now with brownish discharge.  Minimal lower abd cramping.  No FCNAVD.  No thick white discharge.   Meds, vitals, and allergies reviewed.   ROS: See HPI.  Otherwise, noncontributory.  nad Normal introitus for age, no external lesions, mild brownish vaginal discharge, mucosa pink and moist, no vaginal or cervical lesions, no vaginal atrophy, no friaility or hemorrhage, normal uterus size and position, no adnexal masses or tenderness.  Chaperoned exam.   No CMT

## 2012-06-03 NOTE — Patient Instructions (Signed)
Go to the lab on the way out.  We'll contact you with your lab report. Take care.  Start the flagyl today.

## 2012-06-04 LAB — HIV ANTIBODY (ROUTINE TESTING W REFLEX): HIV: NONREACTIVE

## 2012-06-10 ENCOUNTER — Telehealth: Payer: Self-pay | Admitting: Family Medicine

## 2012-06-10 LAB — GC/CHLAMYDIA PROBE AMP: CT Probe RNA: POSITIVE — AB

## 2012-06-10 MED ORDER — AZITHROMYCIN 500 MG PO TABS: 1000.0000 mg | ORAL_TABLET | Freq: Once | ORAL | Status: DC

## 2012-06-10 NOTE — Telephone Encounter (Signed)
rx sent for zmax.

## 2012-06-13 ENCOUNTER — Other Ambulatory Visit: Payer: Self-pay

## 2012-06-13 ENCOUNTER — Telehealth: Payer: Self-pay | Admitting: Family Medicine

## 2012-06-13 MED ORDER — AZITHROMYCIN 500 MG PO TABS: 1000.0000 mg | ORAL_TABLET | Freq: Once | ORAL | Status: DC

## 2012-06-13 NOTE — Telephone Encounter (Signed)
Noted recent positive CT, treated with azithromycin.

## 2012-06-13 NOTE — Telephone Encounter (Signed)
Sent!

## 2012-06-13 NOTE — Telephone Encounter (Signed)
Pt left v/m requesting refill zithromax to [facility]; nephew cleaned out pt's car and did not realize med in bag and threw in trash; pt request refill for lost med.Please advise.

## 2012-06-13 NOTE — Telephone Encounter (Signed)
[facility] Triage Call Report Triage Record Num: [id number] Operator: Rebeca Allegra Patient Name: Allison Schmitt Call Date & Time: 06/10/2012 5:07:30PM Patient Phone: [phone] PCP: Eustaquio Boyden Patient Gender: Female PCP Fax : [phone] Patient DOB: February 14, 1971 [facility] Name[facility] Reason for Call: Caller: [name]/Patient; PCP: Crawford Givens Clelia Croft) [facility]); CB#: [phone]; Call regarding; no answer upon call back. Voice message left. Protocol(s) Used: Office Note Recommended Outcome per Protocol: Information Noted and Sent to Office Reason for Outcome: Caller information to office Care Advice: ~ 06/10/2012 5:09:46PM Page 1 of 1 CAN_TriageRpt_V2

## 2012-07-13 ENCOUNTER — Encounter: Payer: Self-pay | Admitting: Family Medicine

## 2012-07-13 ENCOUNTER — Ambulatory Visit (INDEPENDENT_AMBULATORY_CARE_PROVIDER_SITE_OTHER): Payer: Medicare Other | Admitting: Family Medicine

## 2012-07-13 VITALS — BP 118/78 | HR 88 | Temp 98.5°F | Wt 176.5 lb

## 2012-07-13 DIAGNOSIS — E119 Type 2 diabetes mellitus without complications: Secondary | ICD-10-CM

## 2012-07-13 DIAGNOSIS — N898 Other specified noninflammatory disorders of vagina: Secondary | ICD-10-CM

## 2012-07-13 DIAGNOSIS — E559 Vitamin D deficiency, unspecified: Secondary | ICD-10-CM

## 2012-07-13 DIAGNOSIS — Z113 Encounter for screening for infections with a predominantly sexual mode of transmission: Secondary | ICD-10-CM

## 2012-07-13 LAB — BASIC METABOLIC PANEL
BUN: 10 mg/dL (ref 6–23)
CO2: 26 mEq/L (ref 19–32)
Calcium: 9.5 mg/dL (ref 8.4–10.5)
Chloride: 104 mEq/L (ref 96–112)
Creatinine, Ser: 0.9 mg/dL (ref 0.4–1.2)
GFR: 74.61 mL/min (ref >60.00)
Glucose, Bld: 117 mg/dL — ABNORMAL HIGH (ref 70–99)
Potassium: 4.2 mEq/L (ref 3.5–5.1)
Sodium: 135 mEq/L (ref 135–145)

## 2012-07-13 LAB — POCT WET PREP (WET MOUNT)

## 2012-07-13 LAB — HEMOGLOBIN A1C: Hgb A1c MFr Bld: 7 % — ABNORMAL HIGH (ref 4.6–6.5)

## 2012-07-13 MED ORDER — METRONIDAZOLE 500 MG PO TABS: 500.0000 mg | ORAL_TABLET | Freq: Two times a day (BID) | ORAL | Status: DC

## 2012-07-13 MED ORDER — FLUCONAZOLE 150 MG PO TABS: 150.0000 mg | ORAL_TABLET | Freq: Once | ORAL | Status: DC

## 2012-07-13 NOTE — Addendum Note (Signed)
Addended by: Eustaquio Boyden on: 07/13/2012 12:06 PM   Modules accepted: Orders

## 2012-07-13 NOTE — Patient Instructions (Addendum)
Good to see you today, call us with questions. Wet prep positive for BV.  Treat with flagyl twice daily for 7 days. We wll call you with results of other testing.  Bacterial Vaginosis Bacterial vaginosis (BV) is a vaginal infection where the normal balance of bacteria in the vagina is disrupted. The normal balance is then replaced by an overgrowth of certain bacteria. There are several different kinds of bacteria that can cause BV. BV is the most common vaginal infection in women of childbearing age. CAUSES   The cause of BV is not fully understood. BV develops when there is an increase or imbalance of harmful bacteria.  Some activities or behaviors can upset the normal balance of bacteria in the vagina and put women at increased risk including:  Having a new sex partner or multiple sex partners.  Douching.  Using an intrauterine device (IUD) for contraception.  It is not clear what role sexual activity plays in the development of BV. However, women that have never had sexual intercourse are rarely infected with BV. Women do not get BV from toilet seats, bedding, swimming pools or from touching objects around them.  SYMPTOMS   Grey vaginal discharge.  A fish-like odor with discharge, especially after sexual intercourse.  Itching or burning of the vagina and vulva.  Burning or pain with urination.  Some women have no signs or symptoms at all. DIAGNOSIS  Your caregiver must examine the vagina for signs of BV. Your caregiver will perform lab tests and look at the sample of vaginal fluid through a microscope. They will look for bacteria and abnormal cells (clue cells), a pH test higher than 4.5, and a positive amine test all associated with BV.  RISKS AND COMPLICATIONS   Pelvic inflammatory disease (PID).  Infections following gynecology surgery.  Developing HIV.  Developing herpes virus. TREATMENT  Sometimes BV will clear up without treatment. However, all women with symptoms  of BV should be treated to avoid complications, especially if gynecology surgery is planned. Female partners generally do not need to be treated. However, BV may spread between female sex partners so treatment is helpful in preventing a recurrence of BV.   BV may be treated with antibiotics. The antibiotics come in either pill or vaginal cream forms. Either can be used with nonpregnant or pregnant women, but the recommended dosages differ. These antibiotics are not harmful to the baby.  BV can recur after treatment. If this happens, a second round of antibiotics will often be prescribed.  Treatment is important for pregnant women. If not treated, BV can cause a premature delivery, especially for a pregnant woman who had a premature birth in the past. All pregnant women who have symptoms of BV should be checked and treated.  For chronic reoccurrence of BV, treatment with a type of prescribed gel vaginally twice a week is helpful. HOME CARE INSTRUCTIONS   Finish all medication as directed by your caregiver.  Do not have sex until treatment is completed.  Tell your sexual partner that you have a vaginal infection. They should see their caregiver and be treated if they have problems, such as a mild rash or itching.  Practice safe sex. Use condoms. Only have 1 sex partner. PREVENTION  Basic prevention steps can help reduce the risk of upsetting the natural balance of bacteria in the vagina and developing BV:  Do not have sexual intercourse (be abstinent).  Do not douche.  Use all of the medicine prescribed for treatment of BV,  even if the signs and symptoms go away.  Tell your sex partner if you have BV. That way, they can be treated, if needed, to prevent reoccurrence. SEEK MEDICAL CARE IF:   Your symptoms are not improving after 3 days of treatment.  You have increased discharge, pain, or fever. MAKE SURE YOU:   Understand these instructions.  Will watch your condition.  Will get  help right away if you are not doing well or get worse. FOR MORE INFORMATION  Division of STD Prevention (DSTDP), Centers for Disease Control and Prevention: [employer] American Social Health Association (ASHA): www.ashastd.org  Document Released: 07/13/2005 Document Revised: 10/05/2011 Document Reviewed: 01/03/2009 [facility] Patient Information 2013 [place], [place].

## 2012-07-13 NOTE — Progress Notes (Signed)
  Subjective:    Patient ID: Allison Schmitt, female    DOB: 1970/09/29, 41 y.o.   MRN: [id number]  HPI CC: STD screen  would like to be rechecked for STDs.  2 partners in last year.  No protection use.  Seen here 06/03/2012 with dx BV and Chlamydia, treated with flagyl and zmax.  Partners were not tested/treated. Last BV dx was 03/2012, sxs resolved with flagyl.  No fevers/chills, dysuria, urgency.  LMP - cycle started Saturday.  Not flowing.  Brown discharge, some cramping in abdomen. Cycles normally not regular. H/o uterine ablation 2010, cycles lighter since then.  2001 had gonorrhea, treated.  Past Medical History  Diagnosis Date  . Arthritis     bilateral knees  . Asthma   . History of chicken pox   . Bipolar disorder   . Type 2 diabetes mellitus 2006    Diet controlled currently, prior on metformin  . Headache   . HTN (hypertension)   . HLD (hyperlipidemia)   . History of kidney stones 2007    s/p lithotripsy, 2 currently, stable, has seen uro in past  . Migraines     prior on maxalt  . History of rheumatic fever     with residual murmur  . Vitamin D deficiency   . OSA (obstructive sleep apnea)     hx this, was on CPAP    Past Surgical History  Procedure Date  . Kidney stone surgery 1989    lithotripsy  . Tubal ligation 1997  . Knee surgery 2009    Right  . Uterine ablation 2010    dysmenorrhea and menorrhagia    Review of Systems Per HPI    Objective:   Physical Exam  Nursing note and vitals reviewed. Constitutional: She appears well-developed and well-nourished. No distress.  Genitourinary: There is no rash, tenderness, lesion or injury on the right labia. There is no rash, tenderness, lesion or injury on the left labia. Cervix exhibits no motion tenderness and no friability. No erythema around the vagina. Vaginal discharge (white/green, some red) found.       Assessment & Plan:

## 2012-07-13 NOTE — Assessment & Plan Note (Addendum)
Wet prep today - +whiff, clue cells, will treat for BV with 7d course flagyl bid. Sent off CT/GC genital probe. Will call pt with results.  Requests diflucan as tends to get vaginal yeast infections after flagyl course.

## 2012-07-13 NOTE — Assessment & Plan Note (Signed)
Also requests blood work today - will check A1c and vit D levels.

## 2012-07-14 LAB — GC/CHLAMYDIA PROBE AMP: CT Probe RNA: NEGATIVE

## 2012-07-14 LAB — VITAMIN D 25 HYDROXY (VIT D DEFICIENCY, FRACTURES): Vit D, 25-Hydroxy: 34 ng/mL (ref 30–89)

## 2012-07-25 ENCOUNTER — Encounter: Payer: Self-pay | Admitting: Family Medicine

## 2012-07-25 ENCOUNTER — Ambulatory Visit (INDEPENDENT_AMBULATORY_CARE_PROVIDER_SITE_OTHER): Payer: Medicare Other | Admitting: Family Medicine

## 2012-07-25 VITALS — BP 106/66 | HR 84 | Temp 98.4°F | Ht 62.0 in | Wt 176.0 lb

## 2012-07-25 DIAGNOSIS — J019 Acute sinusitis, unspecified: Secondary | ICD-10-CM | POA: Insufficient documentation

## 2012-07-25 MED ORDER — AZITHROMYCIN 250 MG PO TABS: ORAL_TABLET | ORAL | Status: DC

## 2012-07-25 NOTE — Patient Instructions (Addendum)
Drink lots of fluids  Breathe steam Try putting warm compress on your face  Use nasal saline spray as often as you can  Take the zithromax as directed  Update if not starting to improve in a week or if worsening

## 2012-07-25 NOTE — Assessment & Plan Note (Signed)
With congestion/ chills / facial pain  tx with zpak- is multi drug allergic  Disc symptomatic care - see instructions on AVS  Update if not starting to improve in a week or if worsening

## 2012-07-25 NOTE — Progress Notes (Signed)
Subjective:    Patient ID: Greg Eckrich, female    DOB: 1971-05-17, 41 y.o.   MRN: [id number]  HPI Here for sinus pain after a cold Bad pain under eyes bilaterally - and also some swelling on the L  Whole head is pounding  Having congestion and post nasal drip   ? Fever- some chills  Symptoms for a week- took a whole box of equate sinus relief - with guifenesin  Mucous is clear to yellow/green   Throat ok  Ears feel full- pressure   Patient Active Problem List  Diagnosis  . Vaginal discharge  . Asthma  . Bipolar disorder  . T2DM (type 2 diabetes mellitus)  . HTN (hypertension)  . HLD (hyperlipidemia)  . Migraines  . Neck strain  . Healthcare maintenance  . Vitamin d deficiency  . UTI (urinary tract infection)  . Anxiety   Past Medical History  Diagnosis Date  . Arthritis     bilateral knees  . Asthma   . History of chicken pox   . Bipolar disorder   . Type 2 diabetes mellitus 2006    Diet controlled currently, prior on metformin  . Headache   . HTN (hypertension)   . HLD (hyperlipidemia)   . History of kidney stones 2007    s/p lithotripsy, 2 currently, stable, has seen uro in past  . Migraines     prior on maxalt  . History of rheumatic fever     with residual murmur  . Vitamin D deficiency   . OSA (obstructive sleep apnea)     hx this, was on CPAP   Past Surgical History  Procedure Date  . Kidney stone surgery 1989    lithotripsy  . Tubal ligation 1997  . Knee surgery 2009    Right  . Uterine ablation 2010    dysmenorrhea and menorrhagia   History  Substance Use Topics  . Smoking status: Former Smoker    Types: Cigarettes  . Smokeless tobacco: Never Used  . Alcohol Use: Yes     Comment: 1 glass wine/day   Family History  Problem Relation Age of Onset  . Bipolar disorder Mother   . Diabetes Mother   . Hyperlipidemia Mother   . Hypertension Mother   . Glaucoma Mother   . Stroke Mother   . Coronary artery disease Mother   . Glaucoma  Father   . Thyroid disease Father   . Cancer Paternal Aunt     pancreatic  . Cancer Paternal Grandfather     bone  . Rheum arthritis Mother   . Aneurysm Sister 46    brain, deceased   Allergies  Allergen Reactions  . Ciprofloxacin Anaphylaxis  . Penicillins Anaphylaxis  . Sulfa Antibiotics Anaphylaxis    Thinks has taken bactrim in past...  . Prednisone Rash   Current Outpatient Prescriptions on File Prior to Visit  Medication Sig Dispense Refill  . albuterol (PROAIR HFA) 108 (90 BASE) MCG/ACT inhaler Inhale 2 puffs into the lungs every 6 (six) hours as needed.      . cholecalciferol (VITAMIN D) 1000 UNITS tablet Take 2,000 Units by mouth daily.      . citalopram (CELEXA) 40 MG tablet Take 1 tablet (40 mg total) by mouth daily.  90 tablet  3  . Fluticasone-Salmeterol (ADVAIR) 100-50 MCG/DOSE AEPB Inhale 1 puff into the lungs every 12 (twelve) hours.      Marland Kitchen lisinopril (PRINIVIL,ZESTRIL) 40 MG tablet Take 1 tablet (40 mg total) by  mouth daily.  90 tablet  3  . naproxen (NAPROSYN) 500 MG tablet Take one po bid x 1 week then prn pain, take with food  40 tablet  0  . pravastatin (PRAVACHOL) 20 MG tablet Take 1 tablet (20 mg total) by mouth daily.  90 tablet  3  . fluconazole (DIFLUCAN) 150 MG tablet Take 1 tablet (150 mg total) by mouth once.  1 tablet  0      Review of Systems Review of Systems  Constitutional: Negative for , appetite change,  and unexpected weight change.  ENT pos for facial pain , ear fullness and cong  Eyes: Negative for pain and visual disturbance.  Respiratory: Negative for cough and shortness of breath.   Cardiovascular: Negative for cp or palpitations    Gastrointestinal: Negative for nausea, diarrhea and constipation.  Genitourinary: Negative for urgency and frequency.  Skin: Negative for pallor or rash   Neurological: Negative for weakness, light-headedness, numbness and headaches.  Hematological: Negative for adenopathy. Does not bruise/bleed easily.    Psychiatric/Behavioral: Negative for dysphoric mood. The patient is not nervous/anxious.         Objective:   Physical Exam  Constitutional: She appears well-developed and well-nourished. No distress.       overwt and well app  HENT:  Head: Normocephalic and atraumatic.  Right Ear: External ear normal.  Left Ear: External ear normal.  Mouth/Throat: Oropharynx is clear and moist. No oropharyngeal exudate.       Nares are injected and congested   Bilateral maxillary tenderness worse on L   Eyes: Conjunctivae normal and EOM are normal. Pupils are equal, round, and reactive to light. Right eye exhibits no discharge. Left eye exhibits no discharge. No scleral icterus.  Neck: Normal range of motion. Neck supple.  Cardiovascular: Normal rate and regular rhythm.   Pulmonary/Chest: Effort normal and breath sounds normal.  Lymphadenopathy:    She has no cervical adenopathy.  Neurological: She is alert. No cranial nerve deficit.  Skin: Skin is warm and dry. No rash noted.  Psychiatric: She has a normal mood and affect.          Assessment & Plan:

## 2012-08-26 ENCOUNTER — Encounter: Payer: Self-pay | Admitting: Family Medicine

## 2012-08-26 ENCOUNTER — Ambulatory Visit (INDEPENDENT_AMBULATORY_CARE_PROVIDER_SITE_OTHER): Payer: Medicare Other | Admitting: Family Medicine

## 2012-08-26 VITALS — BP 110/70 | HR 86 | Temp 98.8°F | Ht 62.0 in | Wt 179.8 lb

## 2012-08-26 DIAGNOSIS — S139XXA Sprain of joints and ligaments of unspecified parts of neck, initial encounter: Secondary | ICD-10-CM

## 2012-08-26 DIAGNOSIS — S161XXA Strain of muscle, fascia and tendon at neck level, initial encounter: Secondary | ICD-10-CM

## 2012-08-26 DIAGNOSIS — N898 Other specified noninflammatory disorders of vagina: Secondary | ICD-10-CM

## 2012-08-26 LAB — POCT WET PREP (WET MOUNT): Trichomonas Wet Prep HPF POC: NEGATIVE

## 2012-08-26 MED ORDER — METRONIDAZOLE 500 MG PO TABS: 500.0000 mg | ORAL_TABLET | Freq: Two times a day (BID) | ORAL | Status: DC

## 2012-08-26 MED ORDER — NAPROXEN 500 MG PO TABS: ORAL_TABLET | ORAL | Status: DC

## 2012-08-26 NOTE — Assessment & Plan Note (Signed)
Wet prep: neg whiff but many clue cells seen. Treat BV with 7d flagyl bid. 3rd BV infection in last several months. Consider suppressive therapy. Discussed with pt. Sent off CT/GC per pt reqeust.

## 2012-08-26 NOTE — Addendum Note (Signed)
Addended by: Alvina Chou on: 08/26/2012 02:34 PM   Modules accepted: Orders

## 2012-08-26 NOTE — Progress Notes (Signed)
  Subjective:    Patient ID: Allison Schmitt, female    DOB: 06-04-1971, 42 y.o.   MRN: [id number]  HPI CC: vag discharge  1 wk h/o yellow/clear vaginal discharge with foul odor.  2-3 d h/o abd cramping.  Minimal itch.  No fevers/chills, dysuria, urgency, frequency.  No protection.  Finds when wears condoms gets yeast infection.  Does not douche.  Same partner as when STD screen was negative.  H/o BTL.  LMP early January.  Seen here 07/13/2012 with dx BV and treated with metronidazole (CT/GC negative), and previously 06/03/2012 with dx BV and Chlamydia, treated with flagyl and zmax. Partners were not tested/treated. Prior BV dx was 03/2012, sxs resolved with flagyl.  Past Medical History  Diagnosis Date  . Arthritis     bilateral knees  . Asthma   . History of chicken pox   . Bipolar disorder   . Type 2 diabetes mellitus 2006    Diet controlled currently, prior on metformin  . Headache   . HTN (hypertension)   . HLD (hyperlipidemia)   . History of kidney stones 2007    s/p lithotripsy, 2 currently, stable, has seen uro in past  . Migraines     prior on maxalt  . History of rheumatic fever     with residual murmur  . Vitamin D deficiency   . OSA (obstructive sleep apnea)     hx this, was on CPAP   Past Surgical History  Procedure Date  . Kidney stone surgery 1989    lithotripsy  . Tubal ligation 1997  . Knee surgery 2009    Right  . Uterine ablation 2010    dysmenorrhea and menorrhagia   Review of Systems Per HPI    Objective:   Physical Exam  Nursing note and vitals reviewed. Constitutional: She appears well-developed and well-nourished. No distress.  Genitourinary: Pelvic exam was performed with patient supine. There is no rash, tenderness, lesion or injury on the right labia. There is no rash, tenderness, lesion or injury on the left labia. Cervix exhibits no motion tenderness, no discharge and no friability. No erythema, tenderness or bleeding around the vagina.  No foreign body around the vagina. No signs of injury around the vagina. Vaginal discharge (white discharge present) found.       Assessment & Plan:

## 2012-08-26 NOTE — Patient Instructions (Signed)
Naproxen refilled. Wet prep showing bacterial vaginosis - treat with flagyl twice daily for 7 days. Good to see you today, call us with question. Push fluids and rest.

## 2012-08-26 NOTE — Assessment & Plan Note (Signed)
Refilled naprosyn per pt preference

## 2012-08-27 LAB — GC/CHLAMYDIA PROBE AMP: CT Probe RNA: NEGATIVE

## 2012-08-29 ENCOUNTER — Encounter: Payer: Self-pay | Admitting: *Deleted

## 2012-09-06 ENCOUNTER — Other Ambulatory Visit: Payer: Self-pay

## 2012-09-06 MED ORDER — FLUCONAZOLE 150 MG PO TABS: 150.0000 mg | ORAL_TABLET | Freq: Once | ORAL | Status: DC

## 2012-09-06 NOTE — Telephone Encounter (Signed)
Pt seen 08/26/12 and given Flagyl; now having white creamy vaginal discharge with perineal itching and irritation. Pt request refill Diflucan to [facility]. Pt also said on 09/05/12 when pt woke up was dizzy upon movement. No dizziness now. Pt has head congestion and thinks beginning of sinus infection. Pt has not taken fever but feels warm. Pt wants to know if needs to be seen.Please advise.

## 2012-09-06 NOTE — Telephone Encounter (Signed)
Patient notified

## 2012-09-06 NOTE — Telephone Encounter (Signed)
Sent in diflucan. rec treat with fluids, rest, simple mucinex or immediate release guaifenesin - if not improving or worsening, to come in for eval.

## 2012-09-15 ENCOUNTER — Telehealth: Payer: Self-pay | Admitting: *Deleted

## 2012-09-15 NOTE — Telephone Encounter (Signed)
Form in your IN box for completion. 

## 2012-09-15 NOTE — Telephone Encounter (Signed)
Georgianne dropped off Release to Participate form from [employer] for Dr. Sharen Hones to complete.  Please call patient when ready to pick up at [phone]

## 2012-09-18 NOTE — Telephone Encounter (Signed)
Filled and placed in Kim's box. No contraindication to exercise program. Asthma well controlled currently.

## 2012-09-19 NOTE — Telephone Encounter (Signed)
Message left advising patient. Form placed up front for pick up.

## 2012-10-03 ENCOUNTER — Encounter: Payer: Self-pay | Admitting: Family Medicine

## 2012-10-03 ENCOUNTER — Telehealth: Payer: Self-pay | Admitting: Family Medicine

## 2012-10-03 ENCOUNTER — Ambulatory Visit (INDEPENDENT_AMBULATORY_CARE_PROVIDER_SITE_OTHER): Payer: Medicare Other | Admitting: Family Medicine

## 2012-10-03 VITALS — BP 110/78 | HR 84 | Temp 98.2°F | Wt 174.5 lb

## 2012-10-03 DIAGNOSIS — J029 Acute pharyngitis, unspecified: Secondary | ICD-10-CM | POA: Insufficient documentation

## 2012-10-03 DIAGNOSIS — N898 Other specified noninflammatory disorders of vagina: Secondary | ICD-10-CM

## 2012-10-03 DIAGNOSIS — J069 Acute upper respiratory infection, unspecified: Secondary | ICD-10-CM

## 2012-10-03 LAB — POCT WET PREP (WET MOUNT)
Clue Cells Wet Prep Whiff POC: POSITIVE
KOH Wet Prep POC: NEGATIVE
Trichomonas Wet Prep HPF POC: NEGATIVE

## 2012-10-03 MED ORDER — FLUCONAZOLE 150 MG PO TABS: 150.0000 mg | ORAL_TABLET | Freq: Once | ORAL | Status: DC

## 2012-10-03 MED ORDER — METRONIDAZOLE 0.75 % VA GEL: 1.0000 | VAGINAL | Status: DC

## 2012-10-03 MED ORDER — GUAIFENESIN-CODEINE 100-10 MG/5ML PO SYRP: 5.0000 mL | ORAL_SOLUTION | Freq: Every evening | ORAL | Status: DC | PRN

## 2012-10-03 MED ORDER — METRONIDAZOLE 500 MG PO TABS: 500.0000 mg | ORAL_TABLET | Freq: Two times a day (BID) | ORAL | Status: DC

## 2012-10-03 NOTE — Telephone Encounter (Signed)
Patient Information:  Caller Name: Tessica  Phone: [phone]  Patient: Allison Schmitt, Allison Schmitt  Gender: Female  DOB: [birth year]  Age: 42 Years  PCP: Eustaquio Boyden [facility])  Pregnant: No  Office Follow Up:  Does the office need to follow up with this patient?: No  Instructions For The Office: N/A  RN Note:  Pt needs to be seen for recurring vaginal discharge that has a "fishy" odor to it.   Symptoms  Reason For Call & Symptoms: Pt calling that she was seen by Dr. Sharen Hones 08/26/12 for Bacterial Vaginosis.  She has had several of these infection and he states in his EPIC note that he may try her on suppressive therapy.  Reviewed Health History In EMR: Yes  Reviewed Medications In EMR: Yes  Reviewed Allergies In EMR: Yes  Reviewed Surgeries / Procedures: No  Date of Onset of Symptoms: 09/30/2012 OB / GYN:  LMP: 09/19/2012  Guideline(s) Used:  Vaginal Discharge  Disposition Per Guideline:   Home Care  Reason For Disposition Reached:   Symptoms of a vaginal yeast infection (i.e., white, thick, cottage-cheese-like, itchy, not bad smelling discharge)  Advice Given:  Genital Hygiene:   Keep your genital area clean. Wash daily.  Keep your genital area dry. Wear cotton underwear or underwear with a cotton crotch.  Do not douche.  Do not use feminine hygiene products.  RN Overrode Recommendation:  Make Appointment  Appt needs to be made in the next 3 days.  Appointment Scheduled:  10/03/2012 11:15:00 Appointment Scheduled Provider:  Eustaquio Boyden [facility])

## 2012-10-03 NOTE — Assessment & Plan Note (Signed)
Anticipate viral as improving on its own - supportive care discussed.

## 2012-10-03 NOTE — Progress Notes (Signed)
  Subjective:    Patient ID: Allison Schmitt, female    DOB: [birth year], 42 y.o.   MRN: [id number]  HPI CC: vag discharge  Vaginal sxs for last 3-4 days - also with abd cramping, vaginal discharge with odor.  Does not use protection with boyfriend.  Seen here 08/26/2012 with BV treated with flagyl. Seen here 07/13/2012 with dx BV and treated with metronidazole (CT/GC negative),  Also 06/03/2012 with dx BV and Chlamydia, treated with flagyl and zmax. Partners were not tested/treated.  Prior BV dx was 03/2012, sxs resolved with flagyl.  Also with cold sxs since Thursday.  Head congestion.  Coughing phlegm up.  Head clearing.  No fevers/chills, headache.   Using mucinex which helps. Daughter smokes outside. Daughter started coughing recently.   Past Medical History  Diagnosis Date  . Arthritis     bilateral knees  . Asthma   . History of chicken pox   . Bipolar disorder   . Type 2 diabetes mellitus 2006    Diet controlled currently, prior on metformin  . Headache   . HTN (hypertension)   . HLD (hyperlipidemia)   . History of kidney stones 2007    s/p lithotripsy, 2 currently, stable, has seen uro in past  . Migraines     prior on maxalt  . History of rheumatic fever     with residual murmur  . Vitamin D deficiency   . OSA (obstructive sleep apnea)     hx this, was on CPAP     Review of Systems Per HPI    Objective:   Physical Exam  Nursing note and vitals reviewed. Constitutional: She appears well-developed and well-nourished. No distress.  HENT:  Head: Normocephalic and atraumatic.  Right Ear: Hearing, tympanic membrane, external ear and ear canal normal.  Left Ear: Hearing, external ear and ear canal normal.  Nose: Mucosal edema present. No rhinorrhea. Right sinus exhibits no maxillary sinus tenderness and no frontal sinus tenderness. Left sinus exhibits no maxillary sinus tenderness and no frontal sinus tenderness.  Mouth/Throat: Uvula is midline, oropharynx is  clear and moist and mucous membranes are normal. No oropharyngeal exudate, posterior oropharyngeal edema, posterior oropharyngeal erythema or tonsillar abscesses.  Nasal congestion. L cerumen in canal  Eyes: Conjunctivae and EOM are normal. Pupils are equal, round, and reactive to light. No scleral icterus.  Neck: Normal range of motion. Neck supple.  Cardiovascular: Normal rate, regular rhythm, normal heart sounds and intact distal pulses.   No murmur heard. Pulmonary/Chest: Effort normal and breath sounds normal. No respiratory distress. She has no wheezes. She has no rales.  Abdominal: Soft. Bowel sounds are normal. She exhibits no distension. There is no tenderness. There is no rebound and no guarding.  Genitourinary: Pelvic exam was performed with patient supine. There is no rash, tenderness, lesion or injury on the right labia. There is no rash, tenderness, lesion or injury on the left labia. No erythema, tenderness or bleeding around the vagina. No foreign body around the vagina. No signs of injury around the vagina. Vaginal discharge found.  Lymphadenopathy:    She has no cervical adenopathy.  Skin: Skin is warm and dry. No rash noted.       Assessment & Plan:

## 2012-10-03 NOTE — Telephone Encounter (Signed)
Will see today.  

## 2012-10-03 NOTE — Patient Instructions (Signed)
Looks like recurrent BV infection. Treat with metronidazole twice daily for 7 days then start gel twice weekly. Let us know if any new or persistent symptoms. Good to see you today, call us with questions. Let us know if upper respiratory infection not improving.

## 2012-10-03 NOTE — Assessment & Plan Note (Signed)
Recurrent bacterial vaginosis. Treat with 7d course flagyl, then start twice weekly suppressive metrogel. rec condom use. Denies concerns for concurrent STDs.

## 2012-11-23 ENCOUNTER — Other Ambulatory Visit: Payer: Self-pay

## 2012-11-23 MED ORDER — FLUCONAZOLE 150 MG PO TABS: 150.0000 mg | ORAL_TABLET | Freq: Once | ORAL | Status: DC

## 2012-11-23 MED ORDER — METRONIDAZOLE 500 MG PO TABS: 500.0000 mg | ORAL_TABLET | Freq: Two times a day (BID) | ORAL | Status: DC

## 2012-11-23 NOTE — Telephone Encounter (Signed)
Sent.  Hold the citalopram for 2 days if she takes the diflucan.

## 2012-11-23 NOTE — Telephone Encounter (Signed)
Pt left v/m; pt seen 10/03/12; pt said the metronidazole gel is not working well due to it is too messy. Pt said bacterial vaginosis is returning; noticed same odor and irritation as when seen 10/03/12. Pt request refill of diflucan and metornidazole tabs to [facility].Please advise.

## 2012-11-23 NOTE — Telephone Encounter (Signed)
Message left notifying patient. Advised to call with any questions.

## 2012-12-22 ENCOUNTER — Encounter: Payer: Self-pay | Admitting: Family Medicine

## 2012-12-22 ENCOUNTER — Ambulatory Visit (INDEPENDENT_AMBULATORY_CARE_PROVIDER_SITE_OTHER): Payer: Medicare Other | Admitting: Family Medicine

## 2012-12-22 VITALS — BP 110/76 | HR 80 | Temp 98.6°F | Wt 167.8 lb

## 2012-12-22 DIAGNOSIS — E119 Type 2 diabetes mellitus without complications: Secondary | ICD-10-CM

## 2012-12-22 DIAGNOSIS — J069 Acute upper respiratory infection, unspecified: Secondary | ICD-10-CM

## 2012-12-22 DIAGNOSIS — R34 Anuria and oliguria: Secondary | ICD-10-CM

## 2012-12-22 DIAGNOSIS — E785 Hyperlipidemia, unspecified: Secondary | ICD-10-CM

## 2012-12-22 LAB — POCT URINALYSIS DIPSTICK
Bilirubin, UA: NEGATIVE
Glucose, UA: NEGATIVE
Leukocytes, UA: NEGATIVE
Nitrite, UA: NEGATIVE
Urobilinogen, UA: 0.2
pH, UA: 6.5

## 2012-12-22 LAB — LIPID PANEL
LDL Cholesterol: 118 mg/dL — ABNORMAL HIGH (ref 0–99)
Total CHOL/HDL Ratio: 4
Triglycerides: 119 mg/dL (ref 0.0–149.0)

## 2012-12-22 LAB — HEMOGLOBIN A1C: Hgb A1c MFr Bld: 6.3 % (ref 4.6–6.5)

## 2012-12-22 LAB — BASIC METABOLIC PANEL
Chloride: 103 mEq/L (ref 96–112)
Creatinine, Ser: 0.7 mg/dL (ref 0.4–1.2)
GFR: 93.57 mL/min (ref >60.00)
Potassium: 3.8 mEq/L (ref 3.5–5.1)

## 2012-12-22 LAB — MICROALBUMIN / CREATININE URINE RATIO: Microalb, Ur: 3.3 mg/dL — ABNORMAL HIGH (ref 0.0–1.9)

## 2012-12-22 MED ORDER — CITALOPRAM HYDROBROMIDE 40 MG PO TABS: 40.0000 mg | ORAL_TABLET | Freq: Every day | ORAL | Status: DC

## 2012-12-22 MED ORDER — LISINOPRIL 40 MG PO TABS: 40.0000 mg | ORAL_TABLET | Freq: Every day | ORAL | Status: DC

## 2012-12-22 NOTE — Patient Instructions (Signed)
Sounds like you have a viral upper respiratory infection. Antibiotics are not needed for this.  Viral infections usually take 7-10 days to resolve.  The cough can last several weeks to go away. Use medication as prescribed: continue cough syrup - let us know if you need refill. Push fluids and plenty of rest. Please return if you are not improving as expected, or if you have high fevers (>101.5) or difficulty swallowing or worsening productive cough. Call clinic with questions.  Good to see you today.

## 2012-12-22 NOTE — Assessment & Plan Note (Signed)
Anticipate viral URI vs viral acute sinusitis. supportive care as per instructions. Red flags to return today discussed - discussed if sxs persist past 10 days (into next week), to call us for abx course. UA normal today.

## 2012-12-22 NOTE — Progress Notes (Signed)
  Subjective:    Patient ID: Allison Schmitt, female    DOB: 1971-05-28, 42 y.o.   MRN: [id number]  HPI CC: ?UTI, not feeling well  For the last week not feeling well.  4d ago started feeling body aches, as well as subjective fevers.  Nasal congestion, not voiding as much as she expected with fluid intake.  Noticing pressure at neck and headache.  Significant sinus congestion.  Out of work on Tuesday, left early on Wednesday.  Cough present as well.  ST present.  Using previously prescribed cough syrup at night as well as mucinex.  Denies dysuria, urgency, frequency, hematuria, abd pain, nausea/vomiting, flank pain. No ear or tooth pain. Thinks may have another BV infection - using metrogel for this.  Some abd cramping and vag discharge/odor.  No sick contacts at home.  Daughter now congested. Daughter smokes at home - outside.  Past Medical History  Diagnosis Date  . Arthritis     bilateral knees  . Asthma   . History of chicken pox   . Bipolar disorder   . Type 2 diabetes mellitus 2006    Diet controlled currently, prior on metformin  . Headache(784.0)   . HTN (hypertension)   . HLD (hyperlipidemia)   . History of kidney stones 2007    s/p lithotripsy, 2 currently, stable, has seen uro in past  . Migraines     prior on maxalt  . History of rheumatic fever     with residual murmur  . Vitamin D deficiency   . OSA (obstructive sleep apnea)     hx this, was on CPAP     Review of Systems Per HPI    Objective:   Physical Exam  Nursing note and vitals reviewed. Constitutional: She appears well-developed and well-nourished. No distress.  HENT:  Head: Normocephalic and atraumatic.  Right Ear: Hearing, tympanic membrane, external ear and ear canal normal.  Left Ear: Hearing, tympanic membrane, external ear and ear canal normal.  Nose: Mucosal edema (mild) present. No rhinorrhea. Right sinus exhibits no maxillary sinus tenderness and no frontal sinus tenderness. Left sinus  exhibits no maxillary sinus tenderness and no frontal sinus tenderness.  Mouth/Throat: Uvula is midline, oropharynx is clear and moist and mucous membranes are normal. No oropharyngeal exudate, posterior oropharyngeal edema, posterior oropharyngeal erythema or tonsillar abscesses.  Eyes: Conjunctivae and EOM are normal. Pupils are equal, round, and reactive to light. No scleral icterus.  Neck: Normal range of motion. Neck supple.  Cardiovascular: Normal rate, regular rhythm, normal heart sounds and intact distal pulses.   No murmur heard. Pulmonary/Chest: Effort normal and breath sounds normal. No respiratory distress. She has no wheezes. She has no rales.  Abdominal: Soft. Normal appearance and bowel sounds are normal. She exhibits no distension and no mass. There is no hepatosplenomegaly. There is no tenderness. There is no rigidity, no rebound, no guarding, no CVA tenderness and negative Murphy's sign.  Lymphadenopathy:    She has cervical adenopathy (shotty AC LAD bilaterally).  Skin: Skin is warm and dry. No rash noted.       Assessment & Plan:

## 2012-12-22 NOTE — Assessment & Plan Note (Signed)
Check blood work today, return in 1-2 mo for CPE to review.

## 2012-12-28 ENCOUNTER — Other Ambulatory Visit: Payer: Self-pay | Admitting: Family Medicine

## 2012-12-28 NOTE — Telephone Encounter (Signed)
plz phone in. 

## 2012-12-28 NOTE — Telephone Encounter (Signed)
OK to refill

## 2012-12-29 ENCOUNTER — Encounter: Payer: Self-pay | Admitting: Family Medicine

## 2012-12-29 ENCOUNTER — Ambulatory Visit (INDEPENDENT_AMBULATORY_CARE_PROVIDER_SITE_OTHER): Payer: Medicare Other | Admitting: Family Medicine

## 2012-12-29 VITALS — BP 100/70 | HR 79 | Temp 98.4°F | Wt 168.0 lb

## 2012-12-29 DIAGNOSIS — J029 Acute pharyngitis, unspecified: Secondary | ICD-10-CM

## 2012-12-29 MED ORDER — GUAIFENESIN-CODEINE 100-10 MG/5ML PO SYRP: ORAL_SOLUTION | ORAL | Status: DC

## 2012-12-29 MED ORDER — PHENOL 1.4 % MT LIQD: 1.0000 | OROMUCOSAL | Status: DC | PRN

## 2012-12-29 NOTE — Patient Instructions (Signed)
For throat - use ibuprofen 400-600mg  twice daily with food for pain. May use chloraseptic spray as needed. I've refilled cheratussin. Give me a call if not improving with this.

## 2012-12-29 NOTE — Telephone Encounter (Signed)
Rx called in as prescribed 

## 2012-12-29 NOTE — Assessment & Plan Note (Signed)
Persistent viral pharyngitis vs irritant pharyngitis from significant nasal drainage. Supportive care as per instructions. cheratussin for cough at night, ibuprofen as needed, and chloraseptic spray if needed (if ibuprofen doesn't help). Update if not improving as expected. For sinus drainage - try zyrtec or claritin daily.

## 2012-12-29 NOTE — Progress Notes (Signed)
  Subjective:    Patient ID: Allison Schmitt, female    DOB: [birth year], 42 y.o.   MRN: [id number]  HPI CC: ST  Week 2 of symptoms.  Persistent sore throat, voice cracking with increased use.  Persistent cough at night, dry.  Congestion from last week improving.  Has tried chloraseptic lozenges.    No fevers/chills, abd pain, nausea, HA.  30mo grandson recently died - increased stress with this.  Outside smokers at home.  Past Medical History  Diagnosis Date  . Arthritis     bilateral knees  . Asthma   . History of chicken pox   . Bipolar disorder   . Type 2 diabetes mellitus 2006    Diet controlled currently, prior on metformin  . Headache(784.0)   . HTN (hypertension)   . HLD (hyperlipidemia)   . History of kidney stones 2007    s/p lithotripsy, 2 currently, stable, has seen uro in past  . Migraines     prior on maxalt  . History of rheumatic fever     with residual murmur  . Vitamin D deficiency   . OSA (obstructive sleep apnea)     hx this, was on CPAP     Review of Systems Per HPI    Objective:   Physical Exam  Nursing note and vitals reviewed. Constitutional: She appears well-developed and well-nourished. No distress.  HENT:  Head: Normocephalic and atraumatic.  Right Ear: Hearing, tympanic membrane, external ear and ear canal normal.  Left Ear: Hearing, tympanic membrane, external ear and ear canal normal.  Nose: Mucosal edema present. No rhinorrhea. Right sinus exhibits no maxillary sinus tenderness and no frontal sinus tenderness. Left sinus exhibits no maxillary sinus tenderness and no frontal sinus tenderness.  Mouth/Throat: Uvula is midline and mucous membranes are normal. Posterior oropharyngeal edema and posterior oropharyngeal erythema present. No oropharyngeal exudate or tonsillar abscesses.  Left oropharyngeal and uvular erythema and edema, no exudates.  Eyes: Conjunctivae and EOM are normal. Pupils are equal, round, and reactive to light. No scleral  icterus.  Neck: Normal range of motion. Neck supple.  Cardiovascular: Normal rate, regular rhythm, normal heart sounds and intact distal pulses.   No murmur heard. Pulmonary/Chest: Effort normal and breath sounds normal. No respiratory distress. She has no wheezes. She has no rales.  Lymphadenopathy:    She has cervical adenopathy (R AC LAD).  Skin: Skin is warm and dry. No rash noted.       Assessment & Plan:

## 2013-02-02 ENCOUNTER — Other Ambulatory Visit: Payer: Self-pay

## 2013-02-14 ENCOUNTER — Other Ambulatory Visit: Payer: Self-pay

## 2013-02-14 NOTE — Telephone Encounter (Signed)
Pt said has recurrent bacterial vaginosis and request refill of metronidazole and diflucan to [facility]. Pt has brownish vaginal discharge with stomach cramping. No odor, itching or irritation. Pt said medicare benefits have stopped; pt has hearing in Aug but no insurance now. Pt said Dr Reece Agar had refilled in 10/2012.Please advise.

## 2013-02-15 MED ORDER — METRONIDAZOLE 500 MG PO TABS: 500.0000 mg | ORAL_TABLET | Freq: Two times a day (BID) | ORAL | Status: AC

## 2013-02-15 NOTE — Telephone Encounter (Signed)
Spoke with patient and notified her if flagyl didn't clear symptoms she needed to be seen. She said she did use the metrogel, but it is very messy because she stands on her feet at work all day, so it is just not convenient.

## 2013-02-15 NOTE — Telephone Encounter (Signed)
May send in flagyl course, but if no better will need to be seen here or [facility]. Will not send in diflucan - as has not had yeast infection in past. Last visit we started twice weekly suppressive metrogel - did she take this?

## 2013-03-13 ENCOUNTER — Encounter: Payer: Self-pay | Admitting: Radiology

## 2013-03-14 ENCOUNTER — Ambulatory Visit (INDEPENDENT_AMBULATORY_CARE_PROVIDER_SITE_OTHER): Payer: Medicare Other | Admitting: Family Medicine

## 2013-03-14 ENCOUNTER — Encounter: Payer: Self-pay | Admitting: Family Medicine

## 2013-03-14 VITALS — BP 112/70 | HR 80 | Temp 98.5°F | Ht 62.0 in | Wt 168.2 lb

## 2013-03-14 DIAGNOSIS — I10 Essential (primary) hypertension: Secondary | ICD-10-CM

## 2013-03-14 DIAGNOSIS — F319 Bipolar disorder, unspecified: Secondary | ICD-10-CM

## 2013-03-14 DIAGNOSIS — Z1231 Encounter for screening mammogram for malignant neoplasm of breast: Secondary | ICD-10-CM

## 2013-03-14 DIAGNOSIS — E119 Type 2 diabetes mellitus without complications: Secondary | ICD-10-CM

## 2013-03-14 DIAGNOSIS — E785 Hyperlipidemia, unspecified: Secondary | ICD-10-CM

## 2013-03-14 DIAGNOSIS — Z0000 Encounter for general adult medical examination without abnormal findings: Secondary | ICD-10-CM

## 2013-03-14 DIAGNOSIS — E559 Vitamin D deficiency, unspecified: Secondary | ICD-10-CM

## 2013-03-14 LAB — BASIC METABOLIC PANEL
BUN: 8 mg/dL (ref 6–23)
CO2: 25 mEq/L (ref 19–32)
Chloride: 104 mEq/L (ref 96–112)
Creatinine, Ser: 0.8 mg/dL (ref 0.4–1.2)
Glucose, Bld: 76 mg/dL (ref 70–99)

## 2013-03-14 LAB — LIPID PANEL
LDL Cholesterol: 129 mg/dL — ABNORMAL HIGH (ref 0–99)
Total CHOL/HDL Ratio: 5
Triglycerides: 148 mg/dL (ref 0.0–149.0)

## 2013-03-14 NOTE — Assessment & Plan Note (Signed)
Chronic, stable. Diet controlled. 

## 2013-03-14 NOTE — Progress Notes (Signed)
Subjective:    Patient ID: Allison Schmitt, female    DOB: 12-14-1970, 42 y.o.   MRN: [id number]  HPI CC: CPE  Increased stress recently - buried 6 mo grandson and having issues with disability.  Diet controlled diabetes.  HLD - off pravastatin - forgets to take. HTN - compliant with lisinopril.  Wt Readings from Last 3 Encounters:  03/14/13 168 lb 4 oz (76.318 kg)  12/29/12 168 lb (76.204 kg)  12/22/12 167 lb 12 oz (76.091 kg)   Body mass index is 30.77 kg/(m^2).  Preventative:  Blood work today. Pap smear normal 12/2011.  All normal in last 10 years.  Will space out to Q3 years - next will be due 2016 Breast exam today. To schedule first mammogram.  No fmhx breast cancer that patient knows of. Td 2008 Pneumovax 2008  Seat belt use discussed. Sunscreen use discussed.  No recent sunburns.  Caffeine: has cut down on sodas  Lives with daughter, 1 dog  Occupation: disability from bipolar, looking for part time job  Edu: HS  Activity: walking - not recently, some cardio at home  Diet: good water, occasional fruits/vegetables   Medications and allergies reviewed and updated in chart.  Past histories reviewed and updated if relevant as below. Patient Active Problem List   Diagnosis Date Noted  . Acute pharyngitis 10/03/2012  . Anxiety 04/19/2012  . Healthcare maintenance 01/22/2012  . Vitamin d deficiency   . Neck strain 12/23/2011  . Bacterial vaginosis 11/19/2011  . Asthma   . Bipolar disorder   . T2DM (type 2 diabetes mellitus)   . HTN (hypertension)   . HLD (hyperlipidemia)   . Migraines    Past Medical History  Diagnosis Date  . Arthritis     bilateral knees  . Asthma   . History of chicken pox   . Bipolar disorder   . Type 2 diabetes mellitus 2006    Diet controlled currently, prior on metformin  . Headache(784.0)   . HTN (hypertension)   . HLD (hyperlipidemia)   . History of kidney stones 2007    s/p lithotripsy, 2 currently, stable, has seen uro in  past  . Migraines     prior on maxalt  . History of rheumatic fever     with residual murmur  . Vitamin D deficiency   . OSA (obstructive sleep apnea)     hx this, was on CPAP   Past Surgical History  Procedure Laterality Date  . Kidney stone surgery  1989    lithotripsy  . Tubal ligation  1997  . Knee surgery  2009    Right  . Uterine ablation  2010    dysmenorrhea and menorrhagia   History  Substance Use Topics  . Smoking status: Former Smoker    Types: Cigarettes  . Smokeless tobacco: Never Used  . Alcohol Use: Yes     Comment: 1 glass wine/day   Family History  Problem Relation Age of Onset  . Bipolar disorder Mother   . Diabetes Mother   . Hyperlipidemia Mother   . Hypertension Mother   . Glaucoma Mother   . Stroke Mother   . Coronary artery disease Mother   . Glaucoma Father   . Thyroid disease Father   . Cancer Paternal Aunt     pancreatic  . Cancer Paternal Grandfather     bone  . Rheum arthritis Mother   . Aneurysm Sister 87    brain, deceased   Allergies  Allergen  Reactions  . Ciprofloxacin Anaphylaxis  . Penicillins Anaphylaxis  . Sulfa Antibiotics Anaphylaxis    Thinks has taken bactrim in past...  . Prednisone Rash   Current Outpatient Prescriptions on File Prior to Visit  Medication Sig Dispense Refill  . citalopram (CELEXA) 40 MG tablet Take 1 tablet (40 mg total) by mouth daily.  90 tablet  3  . lisinopril (PRINIVIL,ZESTRIL) 40 MG tablet Take 1 tablet (40 mg total) by mouth daily.  90 tablet  3   No current facility-administered medications on file prior to visit.     Review of Systems  Constitutional: Negative for fever, chills, activity change, appetite change, fatigue and unexpected weight change.  HENT: Positive for congestion. Negative for hearing loss and neck pain.   Eyes: Negative for visual disturbance.  Respiratory: Negative for cough, chest tightness, shortness of breath and wheezing.   Cardiovascular: Negative for chest  pain, palpitations and leg swelling.  Gastrointestinal: Negative for nausea, vomiting, abdominal pain, diarrhea, constipation, blood in stool and abdominal distention.  Genitourinary: Negative for hematuria and difficulty urinating.  Musculoskeletal: Positive for back pain and arthralgias. Negative for myalgias.  Skin: Negative for rash.  Neurological: Negative for dizziness, seizures, syncope and headaches.  Hematological: Negative for adenopathy. Does not bruise/bleed easily.  Psychiatric/Behavioral: Negative for dysphoric mood. The patient is not nervous/anxious.        Objective:   Physical Exam  Nursing note and vitals reviewed. Constitutional: She is oriented to person, place, and time. She appears well-developed and well-nourished. No distress.  HENT:  Head: Normocephalic and atraumatic.  Right Ear: Hearing, tympanic membrane, external ear and ear canal normal.  Left Ear: Hearing, tympanic membrane, external ear and ear canal normal.  Nose: Nose normal.  Mouth/Throat: Oropharynx is clear and moist. No oropharyngeal exudate.  Eyes: Conjunctivae and EOM are normal. Pupils are equal, round, and reactive to light. No scleral icterus.  Neck: Normal range of motion. Neck supple. No thyromegaly present.  Cardiovascular: Normal rate, regular rhythm, normal heart sounds and intact distal pulses.   No murmur heard. Pulses:      Radial pulses are 2+ on the right side, and 2+ on the left side.  Pulmonary/Chest: Effort normal and breath sounds normal. No respiratory distress. She has no wheezes. She has no rales. Right breast exhibits no inverted nipple, no mass, no nipple discharge, no skin change and no tenderness. Left breast exhibits no inverted nipple, no mass, no nipple discharge, no skin change and no tenderness.  Abdominal: Soft. Bowel sounds are normal. She exhibits no distension and no mass. There is no tenderness. There is no rebound and no guarding.  Musculoskeletal: Normal range  of motion. She exhibits no edema.  Lymphadenopathy:    She has no cervical adenopathy.    She has no axillary adenopathy.       Right axillary: No lateral adenopathy present.       Left axillary: No lateral adenopathy present.      Right: No supraclavicular adenopathy present.       Left: No supraclavicular adenopathy present.  Neurological: She is alert and oriented to person, place, and time.  CN grossly intact, station and gait intact  Skin: Skin is warm and dry. No rash noted.  Psychiatric: She has a normal mood and affect. Her behavior is normal. Judgment and thought content normal.      Assessment & Plan:

## 2013-03-14 NOTE — Assessment & Plan Note (Signed)
Preventative protocols reviewed and updated unless pt declined. Discussed healthy diet and lifestyle. Breast exam today.  Pelvic with pap deferred as h/o normal recently. rtc 1 yr or PRN.

## 2013-03-14 NOTE — Assessment & Plan Note (Signed)
Per patient, stable on only celexa.  Pt aware of risks of only treating unipolar depression.

## 2013-03-14 NOTE — Patient Instructions (Signed)
Pass by Marion's office to schedule screening mammogram. Good to see you today, call us with questions. Next pap smear will be due in 2016.

## 2013-03-14 NOTE — Addendum Note (Signed)
Addended by: Eustaquio Boyden on: 03/14/2013 01:51 PM   Modules accepted: Orders

## 2013-03-14 NOTE — Assessment & Plan Note (Signed)
recheck today.  Off pravastatin.

## 2013-03-14 NOTE — Assessment & Plan Note (Signed)
Chronic, stable. Continue meds. 

## 2013-03-15 LAB — VITAMIN D 25 HYDROXY (VIT D DEFICIENCY, FRACTURES): Vit D, 25-Hydroxy: 40 ng/mL (ref 30–89)

## 2013-03-20 ENCOUNTER — Encounter: Payer: Self-pay | Admitting: Family Medicine

## 2013-03-20 ENCOUNTER — Ambulatory Visit: Payer: Self-pay | Admitting: Family Medicine

## 2013-03-21 ENCOUNTER — Other Ambulatory Visit: Payer: Self-pay | Admitting: Family Medicine

## 2013-03-21 DIAGNOSIS — R928 Other abnormal and inconclusive findings on diagnostic imaging of breast: Secondary | ICD-10-CM

## 2013-03-24 ENCOUNTER — Encounter: Payer: Self-pay | Admitting: Family Medicine

## 2013-03-24 ENCOUNTER — Encounter: Payer: Self-pay | Admitting: *Deleted

## 2013-03-24 ENCOUNTER — Ambulatory Visit: Payer: Self-pay | Admitting: Family Medicine

## 2013-03-24 LAB — HM MAMMOGRAPHY: HM Mammogram: NEGATIVE

## 2013-05-03 ENCOUNTER — Ambulatory Visit (INDEPENDENT_AMBULATORY_CARE_PROVIDER_SITE_OTHER): Payer: Medicare Other | Admitting: Family Medicine

## 2013-05-03 ENCOUNTER — Encounter: Payer: Self-pay | Admitting: Family Medicine

## 2013-05-03 VITALS — BP 124/88 | HR 77 | Temp 98.4°F | Ht 62.0 in | Wt 169.0 lb

## 2013-05-03 DIAGNOSIS — R7309 Other abnormal glucose: Secondary | ICD-10-CM

## 2013-05-03 DIAGNOSIS — R109 Unspecified abdominal pain: Secondary | ICD-10-CM | POA: Insufficient documentation

## 2013-05-03 DIAGNOSIS — L089 Local infection of the skin and subcutaneous tissue, unspecified: Secondary | ICD-10-CM

## 2013-05-03 DIAGNOSIS — R5381 Other malaise: Secondary | ICD-10-CM

## 2013-05-03 DIAGNOSIS — R7303 Prediabetes: Secondary | ICD-10-CM

## 2013-05-03 LAB — POCT URINALYSIS DIPSTICK
Ketones, UA: NEGATIVE
Leukocytes, UA: NEGATIVE
Protein, UA: NEGATIVE
pH, UA: 6.5

## 2013-05-03 LAB — HEMOGLOBIN A1C: Hgb A1c MFr Bld: 6.3 % (ref 4.6–6.5)

## 2013-05-03 MED ORDER — DOXYCYCLINE HYCLATE 100 MG PO CAPS: 100.0000 mg | ORAL_CAPSULE | Freq: Two times a day (BID) | ORAL | Status: AC

## 2013-05-03 NOTE — Progress Notes (Signed)
  Subjective:    Patient ID: Allison Schmitt, female    DOB: 1970-11-02, 42 y.o.   MRN: [id number]  HPI CC: UTI , check spot on nose  5d h/o abd pressure with incomplete emptying when voiding.  Frequency.  No dysuria, urgency, hematuria.  + bloated.  No flank pain.  No nausea/vomiting, fevers or chills.    Also wants bump in nose evaluated.  Started 4 days ago.  Occasionally drains.  Very tender.  Feels nose and upper lip swelling.  Has been on ACEI for years.  Started noticing bump 1-2 yrs ago.  No fevers, spreading redness.  Cipro, PCN and sulfa allergy (ANAPHYLAXIS).  Lab Results  Component Value Date   HGBA1C 6.3 12/22/2012  would like A1c checked as well as thyroid checked 2/2 fatigue and aches.  Past Medical History  Diagnosis Date  . Arthritis     bilateral knees  . Asthma   . History of chicken pox   . Bipolar disorder   . Type 2 diabetes mellitus 2006    Diet controlled currently, prior on metformin  . Headache(784.0)   . HTN (hypertension)   . HLD (hyperlipidemia)   . History of kidney stones 2007    s/p lithotripsy, 2 currently, stable, has seen uro in past  . Migraines     prior on maxalt  . History of rheumatic fever     with residual murmur  . Vitamin D deficiency   . OSA (obstructive sleep apnea)     hx this, was on CPAP     Review of Systems Per HPI    Objective:   Physical Exam  Nursing note and vitals reviewed. Constitutional: She appears well-developed and well-nourished. No distress.  HENT:  Head: Normocephalic and atraumatic.  Nose: Nose normal.    Mouth/Throat: Oropharynx is clear and moist. No oropharyngeal exudate.  Slight induration medial inner right nostril Pimple evident just deep to nare opening.  Neck: Normal range of motion. Neck supple. No thyromegaly present.  Abdominal: Soft. Normal appearance and bowel sounds are normal. She exhibits no distension and no mass. There is no hepatosplenomegaly. There is no tenderness. There is no  rigidity, no rebound, no guarding, no CVA tenderness and negative Murphy's sign.  Skin: Skin is warm and dry.  Psychiatric: She has a normal mood and affect.       Assessment & Plan:

## 2013-05-03 NOTE — Assessment & Plan Note (Signed)
UA negative for infection. rec increased fluids and avoid bladder irritants.

## 2013-05-03 NOTE — Patient Instructions (Signed)
For nose - there is a pustule or pimple on the inside of the right nostril. Treat with warm compresses and doxycycline antibiotic course. If spreading redness let us know. Urine returning negative for infection.  Push lots of fluids (water, cranberry juice) and avoid bladder irritants.

## 2013-05-03 NOTE — Assessment & Plan Note (Signed)
Anticipate pustule leading to inflammation.  Treat with short course of doxy and warm compresses Update if not improving as expected. Not consistent with ACEI induced angioedema.

## 2013-05-31 ENCOUNTER — Encounter: Payer: Self-pay | Admitting: Family Medicine

## 2013-05-31 MED ORDER — CYCLOBENZAPRINE HCL 5 MG PO TABS: 5.0000 mg | ORAL_TABLET | Freq: Three times a day (TID) | ORAL | Status: DC | PRN

## 2013-05-31 NOTE — Telephone Encounter (Signed)
See Mychart message. Ok to refill med?

## 2013-06-01 ENCOUNTER — Other Ambulatory Visit: Payer: Self-pay

## 2013-06-19 ENCOUNTER — Encounter: Payer: Self-pay | Admitting: Internal Medicine

## 2013-06-19 ENCOUNTER — Ambulatory Visit (INDEPENDENT_AMBULATORY_CARE_PROVIDER_SITE_OTHER): Payer: Medicare Other | Admitting: Internal Medicine

## 2013-06-19 VITALS — BP 104/68 | HR 90 | Temp 99.2°F | Resp 10 | Ht 62.5 in | Wt 172.8 lb

## 2013-06-19 DIAGNOSIS — L659 Nonscarring hair loss, unspecified: Secondary | ICD-10-CM

## 2013-06-19 DIAGNOSIS — E049 Nontoxic goiter, unspecified: Secondary | ICD-10-CM | POA: Insufficient documentation

## 2013-06-19 MED ORDER — ACCU-CHEK SOFTCLIX LANCETS MISC: Status: AC

## 2013-06-19 MED ORDER — GLUCOSE BLOOD VI STRP: ORAL_STRIP | Status: DC

## 2013-06-19 NOTE — Progress Notes (Signed)
Patient ID: Allison Schmitt, female   DOB: 1970-11-02, 42 y.o.   MRN: [id number]  HPI: Allison Schmitt is a 42 y.o.-year-old female, self-referred, for management of DM2, non-insulin-dependent, controlled, without complications.  Patient has been diagnosed with diabetes in 2006, initially on insulin and Metformin. She could tolerate the Metformin well. She stopped the insulin 1 year after starting it. She also stopped Metformin in 2009-2010. She changed her diet after dx and lost 16 lbs and her diabetes remained controlled w/o meds.   Last hemoglobin A1c was: Lab Results  Component Value Date   HGBA1C 6.3 05/03/2013   HGBA1C 6.3 12/22/2012   HGBA1C 7.0* 07/13/2012  01/18/2012: HbA1c 6.4%  Pt does not check sugars at home - had a FreeStyle meter and strips but expired now.   No lows. Lowest sugar was 81; she has hypoglycemia awareness at 70-80.  Highest sugar was 150.  Pt's meals are: - Breakfast: skipping or [place]thouse Acai berry juice or special K. cereal - Lunch: skips or sometimes another vitamin C + D [place] or cereal - Dinner: Fish/chicken/pork chops/ground [place] + spaghetti + greens - Snacks: every 3 hours: Crackers, rice crispy treats, cashews, peanuts, Apples, chips  - no CKD, last BUN/creatinine:  Lab Results  Component Value Date   BUN 8 03/14/2013   CREATININE 0.8 03/14/2013  She is on Lisinopril. - last set of lipids: Lab Results  Component Value Date   CHOL 198 03/14/2013   HDL 39.70 03/14/2013   LDLCALC 129* 03/14/2013   LDLDIRECT 111.9 01/18/2012   TRIG 148.0 03/14/2013   CHOLHDL 5 03/14/2013  She is not on a statin. She was on statins in the past, no SEs.  - last eye exam was in 07/2012. No DR.  - no numbness and tingling in her feet.  I reviewed her chart and she also has a history of bipolar disorder, vit D def., HTN, HL, asthma, migraines.   Pt has FH of DM in mother, sister: prediabetic.  Hypothyroidism: I reviewed patient's recent thyroid tests, and they are  normal: Lab Results  Component Value Date   TSH 1.18 05/03/2013   TSH 1.81 01/18/2012   However, she c/o: - + hair loss - + dry skin - last 2-3 years - + constipation - + hot flushes - + fatigue  Father has thyroid ds (hypothyroidism). No FH of thyroid cancer.  No personal h/o radiation to head or neck.  Has occasional dysphagia, not nodules felt in neck.  ROS: Constitutional: no weight gain/loss, + fatigue, + subjective hyperthermia, + nocturia, + poor sleep Eyes: +blurry vision, no xerophthalmia ENT: no sore throat, no nodules palpated in throat, + dysphagia/no odynophagia, no hoarseness; + decreased hearing, tinnitus Cardiovascular: no CP/+ SOB/+ palpitations/+ leg swelling Respiratory: no cough/SOB Gastrointestinal: no N/V/D/+ C Musculoskeletal: + all: muscle/joint aches Skin: no rashes; + easy bruising, new stretch marks, hair loss Neurological: no tremors/numbness/tingling/dizziness, + headache Psychiatric: + depression/+ anxiety Occasional low libido  Past Medical History  Diagnosis Date  . Arthritis     bilateral knees  . Asthma   . History of chicken pox   . Bipolar disorder   . Type 2 diabetes mellitus 2006    Diet controlled currently, prior on metformin  . Headache(784.0)   . HTN (hypertension)   . HLD (hyperlipidemia)   . History of kidney stones 2007    s/p lithotripsy, 2 currently, stable, has seen uro in past  . Migraines     prior on maxalt  .  History of rheumatic fever     with residual murmur  . Vitamin D deficiency   . OSA (obstructive sleep apnea)     hx this, was on CPAP   Past Surgical History  Procedure Laterality Date  . Kidney stone surgery  1989    lithotripsy  . Tubal ligation  1997  . Knee surgery  2009    Right  . Uterine ablation  2010    dysmenorrhea and menorrhagia   History   Social History  . Marital Status: Single    Spouse Name: N/A    Number of Children: 2   Occupational History  . Hair stylist   Social  History Main Topics  . Smoking status: Former Smoker    Types: Cigarettes  . Smokeless tobacco: Never Used  . Alcohol Use: Yes     Comment: 1 glass wine or beer 2x a week  . Drug Use: No  . Sexual Activity: Yes    Partners: Male   Social History Narrative   Caffeine: dark sodas occasionally   Lives with daughter, 1 dog   Occupation: disability from bipolar, looking for part time job   Edu: HS   Activity: walking - not recently, some cardio at home   Diet: good water, occasional fruits/vegetables   Current Outpatient Prescriptions on File Prior to Visit  Medication Sig Dispense Refill  . citalopram (CELEXA) 40 MG tablet Take 1 tablet (40 mg total) by mouth daily.  90 tablet  3  . cyclobenzaprine (FLEXERIL) 5 MG tablet Take 1 tablet (5 mg total) by mouth 3 (three) times daily as needed for muscle spasms.  30 tablet  1  . lisinopril (PRINIVIL,ZESTRIL) 40 MG tablet Take 1 tablet (40 mg total) by mouth daily.  90 tablet  3   No current facility-administered medications on file prior to visit.   Allergies  Allergen Reactions  . Ciprofloxacin Anaphylaxis  . Penicillins Anaphylaxis  . Sulfa Antibiotics Anaphylaxis    Thinks has taken bactrim in past...  . Prednisone Rash   Family History  Problem Relation Age of Onset  . Bipolar disorder Mother   . Diabetes Mother   . Hyperlipidemia Mother   . Hypertension Mother   . Glaucoma Mother   . Stroke Mother   . Coronary artery disease Mother   . Glaucoma Father   . Thyroid disease Father   . Cancer Paternal Aunt     pancreatic  . Cancer Paternal Grandfather     bone  . Rheum arthritis Mother   . Aneurysm Sister 109    brain, deceased   PE: BP 104/68  Pulse 90  Temp(Src) 99.2 F (37.3 C) (Oral)  Resp 10  Ht 5' 2.5" (1.588 m)  Wt 172 lb 12.8 oz (78.382 kg)  BMI 31.08 kg/m2  SpO2 95% Wt Readings from Last 3 Encounters:  06/19/13 172 lb 12.8 oz (78.382 kg)  05/03/13 169 lb (76.658 kg)  03/14/13 168 lb 4 oz (76.318 kg)    Constitutional: overweight, in NAD Eyes: PERRLA, EOMI, no exophthalmos ENT: moist mucous membranes, + left thyromegaly, no cervical lymphadenopathy Cardiovascular: RRR, No MRG Respiratory: CTA B Gastrointestinal: abdomen soft, NT, ND, BS+ Musculoskeletal: no deformities, strength intact in all 4 Skin: moist, warm, no rashes, + acanthosis nigricans on neck Neurological: no tremor with outstretched hands, DTR normal in all 4  ASSESSMENT: 1. DM2, non-insulin-dependent, controlled, without complications - diet-ctrl'ed  2. Dry skin/hair loss  3. Prominent left thyroid lobe  PLAN:  1. Patient with mild, recently dx'ed DM, diet-ctrl-ed. - We discussed about options for treatment, and I suggested to: check sugars every other day, rotating checks, before and 2 hours after meal - given sugar log and advised how to fill it and to bring it at next appt  - given foot care handout and explained the principles  - given instructions for hypoglycemia management "15-15 rule"  - advised for yearly eye exams - she is up-to-date - given Accu-Chek normal glucose meter - sent prescriptions for Accu-Chek lancets and strips to her pharmacy - Return to clinic in 6 mo with sugar log   2. Dry skin/hair loss - I reassured the patient that her TSH was normal at the last check and also at the previous check last year - We discussed about possible causes of alopecia:  Pregnancy - she had her tubes tied in 1997 and had endometrial ablation more recently  Menopause - unclear if she is menopausal; she has some hot flashes at night but she believes that these are caused by switching to generic citalopram. Her LMP was earlier last week.   Hypothyroidism - she is not hypothyroid  poor diet - we discussed about improving the diet to include more fruit and vegetables  Stress - advised to try to relax more  Vitamin deficiencies - B12 vitamin and B6 vitamin supplements; is not taking excess vitamin A. Advised  to start multivitamins and also B complex vitamin daily  Micronutrient deficiencies - iron and zinc - these will be contained in the multivitamin tablet  Toxins - no obvious exposure  Medications - she's not on medicines that could cause hair loss - advised that she might need to see a dermatologist. She tells me that her sister had the same problem and saw dermatologist and was told that she had folliculitis. She will discuss this with Dr. Sharen Hones.  3. Prominent left thyroid lobe - I can't feel the left side of her thyroid either enlarged or containing a nodule - I discussed with her about checking a thyroid ultrasound, and she agrees  CLINICAL DATA: Question enlarged left thyroid   EXAM:  THYROID ULTRASOUND   TECHNIQUE:  Ultrasound examination of the thyroid gland and adjacent soft  tissues was performed.   COMPARISON: None.   FINDINGS:  Right thyroid lobe  Measurements: 4.6 x 1.2 x 2.5 cm. The echogenicity of the thyroid  parenchyma is homogeneous. No solid or cystic nodule is seen.  Left thyroid lobe  Measurements: 5.8 x 1.4 x 1.7 cm. The echogenicity of the  parenchyma is homogeneous. No solid or cystic nodule is seen.  Isthmus  Thickness: 4 mm in thickness. No nodules visualized.  Lymphadenopathy  None visualized.   IMPRESSION:  Slightly prominent thyroid gland with no measurable solid or cystic  nodule noted.   Electronically Signed  By: Dwyane Dee M.D.  On: 06/21/2013 10:57  Prominent left thyroid lobe, no nodules! Will let pt know.

## 2013-06-19 NOTE — Patient Instructions (Addendum)
Please come back for a follow-up appointment in 3 months (with your sugar log). You will be called with the appointment for the thyroid U/S. Please check your sugars every other day at different times of the day: before and 2h after a meal (not sooner). Consider seeing a dermatologist for the hair loss. Please come back for a follow-up appointment in 6 months.  PATIENT INSTRUCTIONS FOR TYPE 2 DIABETES:  DIET AND EXERCISE Diet and exercise is an important part of diabetic treatment.  We recommended aerobic exercise in the form of brisk walking (working between 40-60% of maximal aerobic capacity, similar to brisk walking) for 150 minutes per week (such as 30 minutes five days per week) along with 3 times per week performing 'resistance' training (using various gauge rubber tubes with handles) 5-10 exercises involving the major muscle groups (upper body, lower body and core) performing 10-15 repetitions (or near fatigue) each exercise. Start at half the above goal but build slowly to reach the above goals. If limited by weight, joint pain, or disability, we recommend daily walking in a swimming pool with water up to waist to reduce pressure from joints while allow for adequate exercise.    BLOOD GLUCOSES Monitoring your blood glucoses is important for continued management of your diabetes. Please check your blood glucoses: fasting, before meals and at bedtime (you can rotate these measurements - e.g. one day check before the 3 meals, the next day check before 2 of the meals and before bedtime, etc.   HYPOGLYCEMIA (low blood sugar) Hypoglycemia is usually a reaction to not eating, exercising, or taking too much insulin/ other diabetes drugs.  Symptoms include tremors, sweating, hunger, confusion, headache, etc. Treat IMMEDIATELY with 15 grams of Carbs:   4 glucose tablets    cup regular juice/soda   2 tablespoons raisins   4 teaspoons sugar   1 tablespoon honey Recheck blood glucose in 15 mins  and repeat above if still symptomatic/blood glucose <100. Please contact our office at [phone] if you have questions about how to next handle your insulin.  RECOMMENDATIONS TO REDUCE YOUR RISK OF DIABETIC COMPLICATIONS: * Take your prescribed MEDICATION(S). * Follow a DIABETIC diet: Complex carbs, fiber rich foods, heart healthy fish twice weekly, (monounsaturated and polyunsaturated) fats * AVOID saturated/trans fats, high fat foods, >2,300 mg salt per day. * EXERCISE at least 5 times a week for 30 minutes or preferably daily.  * DO NOT SMOKE OR DRINK more than 1 drink a day. * Check your FEET every day. Do not wear tightfitting shoes. Contact us if you develop an ulcer * See your EYE doctor once a year or more if needed * Get a FLU shot once a year * Get a PNEUMONIA vaccine once before and once after age 43 years  GOALS:  * Your Hemoglobin A1c of <7%  * fasting sugars need to be <130 * after meals sugars need to be <180 (2h after you start eating) * Your Systolic BP should be 140 or lower  * Your Diastolic BP should be 80 or lower  * Your HDL (Good Cholesterol) should be 40 or higher  * Your LDL (Bad Cholesterol) should be 100 or lower  * Your Triglycerides should be 150 or lower  * Your Urine microalbumin (kidney function) should be <30 * Your Body Mass Index should be 25 or lower   We will be glad to help you achieve these goals. Our telephone number is: [phone].

## 2013-06-21 ENCOUNTER — Ambulatory Visit
Admission: RE | Admit: 2013-06-21 | Discharge: 2013-06-21 | Disposition: A | Discharge status: Home / Self Care | Payer: Medicare Other | Source: Ambulatory Visit | Attending: Internal Medicine | Admitting: Internal Medicine

## 2013-06-21 DIAGNOSIS — E049 Nontoxic goiter, unspecified: Secondary | ICD-10-CM

## 2013-08-16 ENCOUNTER — Encounter: Payer: Self-pay | Admitting: Family Medicine

## 2013-08-16 MED ORDER — METRONIDAZOLE 500 MG PO TABS: 500.0000 mg | ORAL_TABLET | Freq: Two times a day (BID) | ORAL | Status: DC

## 2013-08-22 LAB — HM DIABETES EYE EXAM: HM DIABETIC EYE EXAM: NORMAL

## 2013-08-24 ENCOUNTER — Encounter: Payer: Self-pay | Admitting: Family Medicine

## 2013-09-06 ENCOUNTER — Encounter: Payer: Self-pay | Admitting: Family Medicine

## 2013-10-11 ENCOUNTER — Telehealth: Payer: Self-pay | Admitting: Family Medicine

## 2013-10-11 ENCOUNTER — Encounter: Payer: Self-pay | Admitting: Family Medicine

## 2013-10-11 ENCOUNTER — Ambulatory Visit (INDEPENDENT_AMBULATORY_CARE_PROVIDER_SITE_OTHER): Payer: Medicare Other | Admitting: Family Medicine

## 2013-10-11 VITALS — BP 110/70 | HR 84 | Temp 98.8°F | Wt 173.5 lb

## 2013-10-11 DIAGNOSIS — R05 Cough: Secondary | ICD-10-CM

## 2013-10-11 DIAGNOSIS — R059 Cough, unspecified: Secondary | ICD-10-CM

## 2013-10-11 MED ORDER — GUAIFENESIN-CODEINE 100-10 MG/5ML PO SYRP: ORAL_SOLUTION | ORAL | Status: DC

## 2013-10-11 NOTE — Patient Instructions (Signed)
Use the cough medicine at night and this should gradually get better.  The cough medicine can make you drowsy.  Take care.  Try to get some rest.

## 2013-10-11 NOTE — Progress Notes (Signed)
Pre visit review using our clinic review tool, if applicable. No additional management support is needed unless otherwise documented below in the visit note.  Sx started about 1 month ago.  Sinus sx initially, some better now, but still with cough at night likely from post nasal gtt.  Dry cough.  No fevers.  No ear pain.  Head still feels stuffy.  Mucinex helped a little.  Some anterior rhinorrhea.    Meds, vitals, and allergies reviewed.   ROS: See HPI.  Otherwise, noncontributory.  GEN: nad, alert and oriented HEENT: mucous membranes moist, tm w/o erythema, nasal exam w/o erythema, clear discharge noted,  OP with cobblestoning, sinuses not ttp NECK: supple w/o LA CV: rrr.   PULM: ctab, no inc wob EXT: no edema SKIN: no acute rash

## 2013-10-11 NOTE — Assessment & Plan Note (Signed)
Nontoxic, ctab, sinuses not ttp.  Would use cough syrup and f/u prn.  Supportive care o/w.  She agrees.

## 2013-10-11 NOTE — Telephone Encounter (Signed)
Relevant patient education assigned to patient using Emmi. ° °

## 2013-11-09 ENCOUNTER — Ambulatory Visit (INDEPENDENT_AMBULATORY_CARE_PROVIDER_SITE_OTHER): Payer: Medicare Other | Admitting: Family Medicine

## 2013-11-09 ENCOUNTER — Ambulatory Visit (INDEPENDENT_AMBULATORY_CARE_PROVIDER_SITE_OTHER)
Admission: RE | Admit: 2013-11-09 | Discharge: 2013-11-09 | Disposition: A | Discharge status: Home / Self Care | Payer: Medicare Other | Source: Ambulatory Visit | Attending: Family Medicine | Admitting: Family Medicine

## 2013-11-09 ENCOUNTER — Encounter: Payer: Self-pay | Admitting: Family Medicine

## 2013-11-09 VITALS — BP 124/68 | HR 83 | Temp 98.1°F | Ht 61.5 in | Wt 175.5 lb

## 2013-11-09 DIAGNOSIS — Z113 Encounter for screening for infections with a predominantly sexual mode of transmission: Secondary | ICD-10-CM

## 2013-11-09 DIAGNOSIS — R059 Cough, unspecified: Secondary | ICD-10-CM

## 2013-11-09 DIAGNOSIS — N760 Acute vaginitis: Secondary | ICD-10-CM

## 2013-11-09 DIAGNOSIS — R05 Cough: Secondary | ICD-10-CM

## 2013-11-09 LAB — POCT URINALYSIS DIPSTICK
Bilirubin, UA: NEGATIVE
Glucose, UA: NEGATIVE
Ketones, UA: NEGATIVE
Nitrite, UA: NEGATIVE
Spec Grav, UA: 1.02
UROBILINOGEN UA: 0.2
pH, UA: 6

## 2013-11-09 MED ORDER — FLUCONAZOLE 150 MG PO TABS: 150.0000 mg | ORAL_TABLET | Freq: Once | ORAL | Status: AC

## 2013-11-09 MED ORDER — HYDROCOD POLST-CHLORPHEN POLST 10-8 MG/5ML PO LQCR: 5.0000 mL | Freq: Every evening | ORAL | Status: DC | PRN

## 2013-11-09 NOTE — Progress Notes (Signed)
Pre visit review using our clinic review tool, if applicable. No additional management support is needed unless otherwise documented below in the visit note. 

## 2013-11-09 NOTE — Addendum Note (Signed)
Addended by: Desmond DikeKNIGHT, Daviyon Widmayer H on: 11/09/2013 10:54 AM   Modules accepted: Orders

## 2013-11-09 NOTE — Assessment & Plan Note (Addendum)
Consistent with yeast- will treat with diflucan 150 mg po x 1. Check GC/Chlamydia. UA positive for RBC, LE- send for cx.

## 2013-11-09 NOTE — Addendum Note (Signed)
Addended by: Alvina ChouWALSH, TERRI J on: 11/09/2013 12:21 PM   Modules accepted: Orders

## 2013-11-09 NOTE — Assessment & Plan Note (Signed)
Given duration of symptoms, will get CXR. Exam benign and reassuring.  Rx given for tussionex.

## 2013-11-09 NOTE — Progress Notes (Signed)
SUBJECTIVE:  43 y.o. female pt of Dr. Reece AgarG, new to me, complains of clear, copious, creamy and malodorous vaginal discharge for 3 day(s).  Does have h/o recurrent yeast infections and BV.  Denies abnormal vaginal bleeding or significant pelvic pain or fever. No UTI symptoms. Denies history of known exposure to STD. S/p BTL and ablation.  Also concerned about cough she is having.  Saw Dr. Para Marchuncan on 3/18 for similar symptoms- note reviewed.  Advised supportive care as likely viral, given rx for robitussin with codeine. This is same cough she had then- never got better.  No longer productive but still coughing.  No fevers, chills or SOB.  Patient's last menstrual period was 09/11/2013.  Patient Active Problem List   Diagnosis Date Noted  . Vaginitis and vulvovaginitis 11/09/2013  . Cough 10/11/2013  . Hair loss 06/19/2013  . Enlarged thyroid 06/19/2013  . Pustule of nostril 05/03/2013  . Abdominal pressure 05/03/2013  . Anxiety 04/19/2012  . Healthcare maintenance 01/22/2012  . Vitamin d deficiency   . Neck strain 12/23/2011  . Bacterial vaginosis 11/19/2011  . Asthma   . Bipolar disorder   . Prediabetes   . HTN (hypertension)   . HLD (hyperlipidemia)   . Migraines    Past Medical History  Diagnosis Date  . Arthritis     bilateral knees  . Asthma   . History of chicken pox   . Bipolar disorder   . Type 2 diabetes mellitus 2006    Diet controlled currently, prior on metformin  . Headache(784.0)   . HTN (hypertension)   . HLD (hyperlipidemia)   . History of kidney stones 2007    s/p lithotripsy, 2 currently, stable, has seen uro in past  . Migraines     prior on maxalt  . History of rheumatic fever     with residual murmur  . Vitamin D deficiency   . OSA (obstructive sleep apnea)     hx this, was on CPAP   Past Surgical History  Procedure Laterality Date  . Kidney stone surgery  1989    lithotripsy  . Tubal ligation  1997  . Knee surgery  2009    Right  . Uterine  ablation  2010    dysmenorrhea and menorrhagia   History  Substance Use Topics  . Smoking status: Current Every Day Smoker    Types: Cigarettes  . Smokeless tobacco: Never Used  . Alcohol Use: Yes     Comment: 1 glass wine/day   Family History  Problem Relation Age of Onset  . Bipolar disorder Mother   . Diabetes Mother   . Hyperlipidemia Mother   . Hypertension Mother   . Glaucoma Mother   . Stroke Mother   . Coronary artery disease Mother   . Glaucoma Father   . Thyroid disease Father   . Cancer Paternal Aunt     pancreatic  . Cancer Paternal Grandfather     bone  . Rheum arthritis Mother   . Aneurysm Sister 46    brain, deceased   Allergies  Allergen Reactions  . Ciprofloxacin Anaphylaxis  . Penicillins Anaphylaxis  . Sulfa Antibiotics Anaphylaxis    Thinks has taken bactrim in past...  . Prednisone Rash   Current Outpatient Prescriptions on File Prior to Visit  Medication Sig Dispense Refill  . ACCU-CHEK SOFTCLIX LANCETS lancets Use every other day  100 each  prn  . citalopram (CELEXA) 40 MG tablet Take 1 tablet (40 mg total) by mouth  daily.  90 tablet  3  . cyclobenzaprine (FLEXERIL) 5 MG tablet Take 1 tablet (5 mg total) by mouth 3 (three) times daily as needed for muscle spasms.  30 tablet  1  . glucose blood test strip Use every other day - Accu-Chek  50 each  prn  . guaiFENesin-codeine (ROBITUSSIN AC) 100-10 MG/5ML syrup TAKE 1 TEASPOONFUL BY MOUTH AT BEDTIME AS NEEDED FOR COUGH  180 mL  0  . lisinopril (PRINIVIL,ZESTRIL) 40 MG tablet Take 1 tablet (40 mg total) by mouth daily.  90 tablet  3   No current facility-administered medications on file prior to visit.   The PMH, PSH, Social History, Family History, Medications, and allergies [facility], and have been updated if relevant.  OBJECTIVE:  BP 124/68  Pulse 83  Temp(Src) 98.1 F (36.7 C) (Oral)  Ht 5' 1.5" (1.562 m)  Wt 175 lb 8 oz (79.606 kg)  BMI 32.63 kg/m2  SpO2 97%  LMP  09/11/2013  She appears well, afebrile. Lungs:  CTA bilaterally Abdomen: benign, soft, nontender, no masses. Pelvic Exam: normal external genitalia, vulva, vagina, cervix, uterus and adnexa. Urine dipstick: positive for RBC's, positive for protein and positive for leukocytes.

## 2013-11-09 NOTE — Patient Instructions (Addendum)
Nice to meet you. We will call you with your xray results, vaginal swab,  and urine culture results.  Take Diflucan as directed.

## 2013-11-10 ENCOUNTER — Encounter: Payer: Self-pay | Admitting: Family Medicine

## 2013-11-10 LAB — GC/CHLAMYDIA PROBE AMP
CT Probe RNA: NEGATIVE
GC PROBE AMP APTIMA: NEGATIVE

## 2013-11-11 LAB — URINE CULTURE: Colony Count: >=100000

## 2013-11-15 ENCOUNTER — Encounter: Payer: Self-pay | Admitting: *Deleted

## 2013-12-12 ENCOUNTER — Encounter: Payer: Self-pay | Admitting: Family Medicine

## 2013-12-13 ENCOUNTER — Other Ambulatory Visit: Payer: Self-pay | Admitting: Family Medicine

## 2013-12-13 DIAGNOSIS — N390 Urinary tract infection, site not specified: Secondary | ICD-10-CM

## 2013-12-13 MED ORDER — CEPHALEXIN 500 MG PO CAPS: 500.0000 mg | ORAL_CAPSULE | Freq: Two times a day (BID) | ORAL | Status: AC

## 2013-12-21 ENCOUNTER — Ambulatory Visit: Payer: Medicare Other | Admitting: Internal Medicine

## 2014-01-08 ENCOUNTER — Encounter: Payer: Self-pay | Admitting: Family Medicine

## 2014-01-08 ENCOUNTER — Ambulatory Visit (INDEPENDENT_AMBULATORY_CARE_PROVIDER_SITE_OTHER): Payer: Medicare Other | Admitting: Family Medicine

## 2014-01-08 VITALS — BP 126/84 | HR 80 | Temp 98.5°F | Wt 173.8 lb

## 2014-01-08 DIAGNOSIS — N760 Acute vaginitis: Secondary | ICD-10-CM

## 2014-01-08 MED ORDER — CITALOPRAM HYDROBROMIDE 40 MG PO TABS: 40.0000 mg | ORAL_TABLET | Freq: Every day | ORAL | Status: DC

## 2014-01-08 MED ORDER — LISINOPRIL 40 MG PO TABS: 40.0000 mg | ORAL_TABLET | Freq: Every day | ORAL | Status: DC

## 2014-01-08 NOTE — Progress Notes (Signed)
Pre visit review using our clinic review tool, if applicable. No additional management support is needed unless otherwise documented below in the visit note. 

## 2014-01-08 NOTE — Progress Notes (Signed)
Computers down/slow. See scanned form for today's visit. Charges done electronically. 

## 2014-01-08 NOTE — Addendum Note (Signed)
Addended by: Alvina ChouWALSH, Jadynn Epping J on: 01/08/2014 12:32 PM   Modules accepted: Orders

## 2014-01-08 NOTE — Patient Instructions (Addendum)
Wet prep checked today. We will call you with results. I've refilled lisinopril and celexa. Return in 4 months for blood work and afterwards physical or office visit.

## 2014-01-09 ENCOUNTER — Telehealth: Payer: Self-pay | Admitting: Family Medicine

## 2014-01-09 ENCOUNTER — Telehealth: Payer: Self-pay

## 2014-01-09 LAB — WET PREP BY MOLECULAR PROBE
CANDIDA SPECIES: POSITIVE — AB
GARDNERELLA VAGINALIS: POSITIVE — AB
Trichomonas vaginosis: NEGATIVE

## 2014-01-09 MED ORDER — METRONIDAZOLE 500 MG PO TABS
500.0000 mg | ORAL_TABLET | Freq: Two times a day (BID) | ORAL | Status: DC
Start: 2014-01-09 — End: 2014-01-16

## 2014-01-09 MED ORDER — FLUCONAZOLE 150 MG PO TABS: 150.0000 mg | ORAL_TABLET | Freq: Once | ORAL | Status: DC

## 2014-01-09 NOTE — Telephone Encounter (Signed)
Relevant patient education assigned to patient using Emmi. ° °

## 2014-01-09 NOTE — Telephone Encounter (Signed)
Pt was seen on 01/08/14 and pt request test results called to [phone]. [facility]

## 2014-01-09 NOTE — Telephone Encounter (Signed)
plz notify pt one pending test But wet prep returned positive for yeast infection and BV. Treat with 7d flagyl course then take diflucan x1. Both sent to pharmacy.

## 2014-01-10 NOTE — Telephone Encounter (Signed)
Per Mychart, patient has viewed results.

## 2014-01-11 ENCOUNTER — Telehealth: Payer: Self-pay | Admitting: Family Medicine

## 2014-01-11 LAB — GC/CHLAMYDIA PROBE AMP
CT Probe RNA: NEGATIVE
GC Probe RNA: NEGATIVE

## 2014-01-11 NOTE — Telephone Encounter (Signed)
Please advise. I didn't see blood work. According to Mychart message, that's what you were waiting on.

## 2014-01-11 NOTE — Telephone Encounter (Signed)
Patient received the results for one of her lab tests, but is still waiting for the results of the second test.  Please call patient.

## 2014-01-11 NOTE — Telephone Encounter (Signed)
Thanks! Viewed by patient.

## 2014-01-11 NOTE — Telephone Encounter (Signed)
Released CT/GC results via mychart.

## 2014-01-16 ENCOUNTER — Ambulatory Visit (INDEPENDENT_AMBULATORY_CARE_PROVIDER_SITE_OTHER): Payer: Medicare Other | Admitting: Obstetrics & Gynecology

## 2014-01-16 ENCOUNTER — Encounter: Payer: Self-pay | Admitting: Obstetrics & Gynecology

## 2014-01-16 VITALS — BP 131/85 | HR 85 | Ht 62.0 in | Wt 175.0 lb

## 2014-01-16 DIAGNOSIS — Z113 Encounter for screening for infections with a predominantly sexual mode of transmission: Secondary | ICD-10-CM

## 2014-01-16 DIAGNOSIS — L292 Pruritus vulvae: Secondary | ICD-10-CM

## 2014-01-16 DIAGNOSIS — L293 Anogenital pruritus, unspecified: Secondary | ICD-10-CM

## 2014-01-16 NOTE — Progress Notes (Signed)
   CLINIC ENCOUNTER NOTE  History:  43 y.o. F here today for evaluation for STIs. No known exposure. Already tested negative for GC/Chlam recently; also was treated for BV and yeast 2/2 vulvovaginitis.  Reports possible UTI symptoms.  The following portions of the patient's history were reviewed and updated as appropriate: allergies, current medications, past family history, past medical history, past social history, past surgical history and problem list. Normal pap in 2013, no history of dysplasia.  Review of Systems:  Pertinent items are noted in HPI.  Objective:  Physical Exam BP 131/85  Pulse 85  Ht 5\' 2"  (1.575 m)  Wt 175 lb (79.379 kg)  BMI 32.00 kg/m2  LMP 12/16/2013  Assessment & Plan:  Serum STI screen and urine culture done, will follow up results and manage accordingly. Proper vulvar hygiene emphasized: discussed avoidance of perfumed soaps, detergents, lotions and any type of douches; in addition to wearing cotton underwear and no underwear at night.     Jaynie CollinsUGONNA  ANYANWU, MD, FACOG Attending Obstetrician & Gynecologist Center for [facility], [facility]

## 2014-01-16 NOTE — Patient Instructions (Signed)
Return to clinic for any scheduled appointments or for any gynecologic concerns as needed.   

## 2014-01-17 LAB — HIV ANTIBODY (ROUTINE TESTING W REFLEX): HIV 1&2 Ab, 4th Generation: NONREACTIVE

## 2014-01-17 LAB — HEPATITIS C ANTIBODY: HCV Ab: NEGATIVE

## 2014-01-17 LAB — URINE CULTURE
Colony Count: NO GROWTH
ORGANISM ID, BACTERIA: NO GROWTH

## 2014-01-17 LAB — RPR

## 2014-02-19 ENCOUNTER — Encounter: Payer: Self-pay | Admitting: Family Medicine

## 2014-02-19 MED ORDER — METRONIDAZOLE 500 MG PO TABS: 500.0000 mg | ORAL_TABLET | Freq: Two times a day (BID) | ORAL | Status: DC

## 2014-05-09 ENCOUNTER — Ambulatory Visit: Payer: Medicare Other | Admitting: Family Medicine

## 2014-05-09 DIAGNOSIS — Z0289 Encounter for other administrative examinations: Secondary | ICD-10-CM

## 2014-05-10 ENCOUNTER — Ambulatory Visit (INDEPENDENT_AMBULATORY_CARE_PROVIDER_SITE_OTHER): Payer: Medicare Other | Admitting: Family Medicine

## 2014-05-10 ENCOUNTER — Encounter: Payer: Self-pay | Admitting: Family Medicine

## 2014-05-10 VITALS — BP 114/64 | HR 84 | Temp 98.4°F | Wt 175.2 lb

## 2014-05-10 DIAGNOSIS — N760 Acute vaginitis: Secondary | ICD-10-CM

## 2014-05-10 MED ORDER — METRONIDAZOLE 500 MG PO TABS: 500.0000 mg | ORAL_TABLET | Freq: Two times a day (BID) | ORAL | Status: DC

## 2014-05-10 NOTE — Addendum Note (Signed)
Addended by: Alvina ChouWALSH, TERRI J on: 05/10/2014 04:33 PM   Modules accepted: Orders

## 2014-05-10 NOTE — Progress Notes (Signed)
   BP 114/64  Pulse 84  Temp(Src) 98.4 F (36.9 C) (Oral)  Wt 175 lb 4 oz (79.493 kg)  LMP 04/27/2014   CC: recurrent vaginitis  Subjective:    Patient ID: Allison Schmitt, female    DOB: [birth year], 43 y.o.   MRN: [id number]  HPI: Allison Lemmingsrika R Bosket is a 43 y.o. female presenting on 05/10/2014 for Vaginal Discharge   H/o recurrent BV. Yeast infection 10/2013. Last seen here 12/2013 with symptomatic BV and yeast infection s/p treatment, then treated 01/2014 over mychart with 1 wk flagyl course for presumed recurrent BV. Again 1 month ago (03/2014) self treated with OTC monistat for possible yeast infection. Now presents with recurrent BV sxs - brownish discharge after cycle to yellow discharge with odor. No itching. Mild cramping in abdomen. Denies dysuria, urgency, frequency. Could drink more water.  H/o uterine ablation and BTL.  Hasn't taken any probiotics.  LMP last week.  Does use condoms now. Monogamous relationship with partner.  Relevant past medical, surgical, family and social history reviewed and updated as indicated.  Allergies and medications reviewed and updated. Current Outpatient Prescriptions on File Prior to Visit  Medication Sig  . ACCU-CHEK SOFTCLIX LANCETS lancets Use every other day  . citalopram (CELEXA) 40 MG tablet Take 1 tablet (40 mg total) by mouth daily.  . cyclobenzaprine (FLEXERIL) 5 MG tablet Take 1 tablet (5 mg total) by mouth 3 (three) times daily as needed for muscle spasms.  Marland Kitchen. glucose blood test strip Use every other day - Accu-Chek  . lisinopril (PRINIVIL,ZESTRIL) 40 MG tablet Take 1 tablet (40 mg total) by mouth daily.  . metroNIDAZOLE (FLAGYL) 500 MG tablet Take 1 tablet (500 mg total) by mouth 2 (two) times daily.   No current facility-administered medications on file prior to visit.    Review of Systems Per HPI unless specifically indicated above    Objective:    BP 114/64  Pulse 84  Temp(Src) 98.4 F (36.9 C) (Oral)  Wt 175 lb 4 oz  (79.493 kg)  LMP 04/27/2014  Physical Exam  Nursing note and vitals reviewed. Constitutional: She appears well-developed and well-nourished. No distress.  Abdominal: Soft. Normal appearance and bowel sounds are normal. She exhibits no distension and no mass. There is no hepatosplenomegaly. There is no tenderness. There is no rigidity, no rebound, no guarding, no CVA tenderness and negative Murphy's sign.  Genitourinary: Uterus normal. Pelvic exam was performed with patient supine. There is no rash, tenderness, lesion or injury on the right labia. There is no rash, tenderness, lesion or injury on the left labia. Cervix exhibits no motion tenderness, no discharge and no friability. Right adnexum displays no mass, no tenderness and no fullness. Left adnexum displays no mass, no tenderness and no fullness. Vaginal discharge (thin white) found.  Odor present       Assessment & Plan:   Problem List Items Addressed This Visit   Vaginitis and vulvovaginitis - Primary     Recurrent vaginitis - wet prep sent again today. Exam consistent with BV so will treat with flagyl. Update if sxs persist    Relevant Orders      Wet prep, genital       Follow up plan: Return if symptoms worsen or fail to improve.

## 2014-05-10 NOTE — Progress Notes (Signed)
Pre visit review using our clinic review tool, if applicable. No additional management support is needed unless otherwise documented below in the visit note. 

## 2014-05-10 NOTE — Patient Instructions (Signed)
Wet prep sent - we will call you with results. Likely bacterial vaginosis recurrence - treat with another flagyl course sent to pharmacy.

## 2014-05-10 NOTE — Assessment & Plan Note (Addendum)
Recurrent vaginitis - wet prep sent again today. Exam consistent with BV so will treat with flagyl. Update if sxs persist

## 2014-05-11 ENCOUNTER — Telehealth: Payer: Self-pay | Admitting: Family Medicine

## 2014-05-11 LAB — WET PREP BY MOLECULAR PROBE
CANDIDA SPECIES: NEGATIVE
GARDNERELLA VAGINALIS: POSITIVE — AB
Trichomonas vaginosis: NEGATIVE

## 2014-05-11 NOTE — Telephone Encounter (Signed)
emmi emailed °

## 2014-06-14 ENCOUNTER — Encounter: Payer: Self-pay | Admitting: Family Medicine

## 2014-06-18 ENCOUNTER — Encounter: Payer: Self-pay | Admitting: Family Medicine

## 2014-06-18 NOTE — Telephone Encounter (Signed)
Handicap placard form placed in Kim's box.

## 2014-06-30 ENCOUNTER — Encounter: Payer: Self-pay | Admitting: Family Medicine

## 2014-06-30 ENCOUNTER — Ambulatory Visit (INDEPENDENT_AMBULATORY_CARE_PROVIDER_SITE_OTHER): Payer: Medicare Other | Admitting: Family Medicine

## 2014-06-30 VITALS — BP 100/70 | Temp 98.5°F | Wt 177.0 lb

## 2014-06-30 DIAGNOSIS — B9689 Other specified bacterial agents as the cause of diseases classified elsewhere: Secondary | ICD-10-CM

## 2014-06-30 DIAGNOSIS — A499 Bacterial infection, unspecified: Secondary | ICD-10-CM

## 2014-06-30 DIAGNOSIS — N760 Acute vaginitis: Secondary | ICD-10-CM

## 2014-06-30 MED ORDER — METRONIDAZOLE 500 MG PO TABS: 500.0000 mg | ORAL_TABLET | Freq: Two times a day (BID) | ORAL | Status: DC

## 2014-06-30 NOTE — Progress Notes (Signed)
OFFICE NOTE  06/30/2014  CC:  Chief Complaint  Patient presents with  . discharge    pt c/o vaginal discharge and states she gets recurrent yeast infections and she thinks this may be the start of one   HPI: Patient is a 43 y.o. African-American female who is here for vaginal complaint.   Vag d/c began 2 wks ago, says horrible creamy, white, fishy odor c/w her past discharges when diagnosed with BV.  Dr. Sharen HonesGutierrez gave her metrogel in the past and she used this for 4-5 days this time and she says the fishy smell went away and the discharge is a light yellow appearance and smaller amount.  She has no vaginal itching.  She is here b/c she is out of the metrogel --is about 75% improved--but feels like she still has abnormal discharge.    Menses was 1 wk ago, lasted 5 d as usual.  Pertinent PMH:  Past medical, surgical, social, and family history reviewed and no changes are noted since last office visit.  MEDS:  Lisinopril 40mg  qd, flexeril 5mg  qd, citalopram 40mg  qd  PE: Blood pressure 100/70, temperature 98.5 F (36.9 C), weight 177 lb (80.287 kg). Gen: Alert, well appearing.  Patient is oriented to person, place, time, and situation. No further exam today.  IMPRESSION AND PLAN:  Bacterial vaginosis Metronidazole 500 mg bid x 7d.   An After Visit Summary was printed and given to the patient.  FOLLOW UP: prn

## 2014-07-03 NOTE — Assessment & Plan Note (Signed)
Metronidazole 500 mg bid x 7d.

## 2014-08-28 ENCOUNTER — Encounter: Payer: Self-pay | Admitting: Internal Medicine

## 2014-08-28 ENCOUNTER — Ambulatory Visit (INDEPENDENT_AMBULATORY_CARE_PROVIDER_SITE_OTHER): Payer: Medicare Other | Admitting: Internal Medicine

## 2014-08-28 VITALS — BP 110/74 | HR 76 | Temp 98.4°F | Wt 174.0 lb

## 2014-08-28 DIAGNOSIS — N760 Acute vaginitis: Secondary | ICD-10-CM

## 2014-08-28 DIAGNOSIS — B373 Candidiasis of vulva and vagina: Secondary | ICD-10-CM

## 2014-08-28 DIAGNOSIS — A499 Bacterial infection, unspecified: Secondary | ICD-10-CM

## 2014-08-28 DIAGNOSIS — B9689 Other specified bacterial agents as the cause of diseases classified elsewhere: Secondary | ICD-10-CM

## 2014-08-28 DIAGNOSIS — B3731 Acute candidiasis of vulva and vagina: Secondary | ICD-10-CM

## 2014-08-28 MED ORDER — FLUCONAZOLE 150 MG PO TABS: 150.0000 mg | ORAL_TABLET | Freq: Once | ORAL | Status: DC

## 2014-08-28 MED ORDER — METRONIDAZOLE 0.75 % VA GEL: 1.0000 | Freq: Two times a day (BID) | VAGINAL | Status: DC

## 2014-08-28 NOTE — Patient Instructions (Signed)
Bacterial Vaginosis Bacterial vaginosis is a vaginal infection that occurs when the normal balance of bacteria in the vagina is disrupted. It results from an overgrowth of certain bacteria. This is the most common vaginal infection in women of childbearing age. Treatment is important to prevent complications, especially in pregnant women, as it can cause a premature delivery. CAUSES  Bacterial vaginosis is caused by an increase in harmful bacteria that are normally present in smaller amounts in the vagina. Several different kinds of bacteria can cause bacterial vaginosis. However, the reason that the condition develops is not fully understood. RISK FACTORS Certain activities or behaviors can put you at an increased risk of developing bacterial vaginosis, including:  Having a new sex partner or multiple sex partners.  Douching.  Using an intrauterine device (IUD) for contraception. Women do not get bacterial vaginosis from toilet seats, bedding, swimming pools, or contact with objects around them. SIGNS AND SYMPTOMS  Some women with bacterial vaginosis have no signs or symptoms. Common symptoms include:  Grey vaginal discharge.  A fishlike odor with discharge, especially after sexual intercourse.  Itching or burning of the vagina and vulva.  Burning or pain with urination. DIAGNOSIS  Your health care provider will take a medical history and examine the vagina for signs of bacterial vaginosis. A sample of vaginal fluid may be taken. Your health care provider will look at this sample under a microscope to check for bacteria and abnormal cells. A vaginal pH test may also be done.  TREATMENT  Bacterial vaginosis may be treated with antibiotic medicines. These may be given in the form of a pill or a vaginal cream. A second round of antibiotics may be prescribed if the condition comes back after treatment.  HOME CARE INSTRUCTIONS   Only take over-the-counter or prescription medicines as  directed by your health care provider.  If antibiotic medicine was prescribed, take it as directed. Make sure you finish it even if you start to feel better.  Do not have sex until treatment is completed.  Tell all sexual partners that you have a vaginal infection. They should see their health care provider and be treated if they have problems, such as a mild rash or itching.  Practice safe sex by using condoms and only having one sex partner. SEEK MEDICAL CARE IF:   Your symptoms are not improving after 3 days of treatment.  You have increased discharge or pain.  You have a fever. MAKE SURE YOU:   Understand these instructions.  Will watch your condition.  Will get help right away if you are not doing well or get worse. FOR MORE INFORMATION  Centers for Disease Control and Prevention, Division of STD Prevention: www.cdc.gov/std American Sexual Health Association (ASHA): www.ashastd.org  Document Released: 07/13/2005 Document Revised: 05/03/2013 Document Reviewed: 02/22/2013 Exit[employer] Patient Information 2015 [employer]. This information is not intended to replace advice given to you by your health care provider. Make sure you discuss any questions you have with your health care provider.  

## 2014-08-28 NOTE — Progress Notes (Signed)
Pre visit review using our clinic review tool, if applicable. No additional management support is needed unless otherwise documented below in the visit note. 

## 2014-08-28 NOTE — Progress Notes (Signed)
Subjective:    Patient ID: Allison Schmitt, female    DOB: 29-May-1971, 44 y.o.   MRN: [id number]  HPI  Pt presents to the clinic today with c/o vaginal discharge.  Review of Systems      Past Medical History  Diagnosis Date  . Arthritis     bilateral knees  . Sciatica     per patient  . History of chicken pox   . Bipolar disorder   . Type 2 diabetes mellitus 2006    Diet controlled currently, prior on metformin  . Headache(784.0)   . HTN (hypertension)   . HLD (hyperlipidemia)   . History of kidney stones 2007    s/p lithotripsy, 2 currently, stable, has seen uro in past  . Migraines     prior on maxalt  . History of rheumatic fever     with residual murmur  . Vitamin D deficiency   . OSA (obstructive sleep apnea)     hx this, was on CPAP  . Asthma     Current Outpatient Prescriptions  Medication Sig Dispense Refill  . ACCU-CHEK SOFTCLIX LANCETS lancets Use every other day 100 each prn  . citalopram (CELEXA) 40 MG tablet Take 1 tablet (40 mg total) by mouth daily. 90 tablet 3  . cyclobenzaprine (FLEXERIL) 5 MG tablet Take 1 tablet (5 mg total) by mouth 3 (three) times daily as needed for muscle spasms. 30 tablet 1  . glucose blood test strip Use every other day - Accu-Chek 50 each prn  . lisinopril (PRINIVIL,ZESTRIL) 40 MG tablet Take 1 tablet (40 mg total) by mouth daily. 90 tablet 3  . metroNIDAZOLE (FLAGYL) 500 MG tablet Take 1 tablet (500 mg total) by mouth 2 (two) times daily. 14 tablet 0   No current facility-administered medications for this visit.    Allergies  Allergen Reactions  . Ciprofloxacin Anaphylaxis  . Penicillins Anaphylaxis  . Sulfa Antibiotics Anaphylaxis    Thinks has taken bactrim in past...  . Prednisone Rash    Family History  Problem Relation Age of Onset  . Bipolar disorder Mother   . Diabetes Mother   . Hyperlipidemia Mother   . Hypertension Mother   . Glaucoma Mother   . Stroke Mother   . Coronary artery disease Mother     . Glaucoma Father   . Thyroid disease Father   . Cancer Paternal Aunt     pancreatic  . Cancer Paternal Grandfather     bone  . Rheum arthritis Mother   . Aneurysm Sister 49    brain, deceased    History   Social History  . Marital Status: Single    Spouse Name: N/A    Number of Children: N/A  . Years of Education: N/A   Occupational History  . Not on file.   Social History Main Topics  . Smoking status: Current Some Day Smoker    Types: Cigars  . Smokeless tobacco: Never Used     Comment: 1 cigar 3-4 x/week  . Alcohol Use: Yes     Comment: occasional  . Drug Use: No  . Sexual Activity:    Partners: Male    Birth Control/ Protection: Surgical     Comment: uterine ablation/tn   Other Topics Concern  . Not on file   Social History Narrative   Caffeine: dark sodas occasionally   Lives with daughter, 1 dog   Occupation: disability from bipolar, looking for part time job   Edu:  HS   Activity: walking - not recently, some cardio at home   Diet: good water, occasional fruits/vegetables     Constitutional: Denies fever, malaise, fatigue, headache or abrupt weight changes.  HEENT: Denies eye pain, eye redness, ear pain, ringing in the ears, wax buildup, runny nose, nasal congestion, bloody nose, or sore throat. Respiratory: Denies difficulty breathing, shortness of breath, cough or sputum production.   Cardiovascular: Denies chest pain, chest tightness, palpitations or swelling in the hands or feet.  Gastrointestinal: Denies abdominal pain, bloating, constipation, diarrhea or blood in the stool.  GU: Denies urgency, frequency, pain with urination, burning sensation, blood in urine, odor or discharge. Musculoskeletal: Denies decrease in range of motion, difficulty with gait, muscle pain or joint pain and swelling.  Skin: Denies redness, rashes, lesions or ulcercations.  Neurological: Denies dizziness, difficulty with memory, difficulty with speech or problems with  balance and coordination.   No other specific complaints in a complete review of systems (except as listed in HPI above).  Objective:   Physical Exam   HPI    Review of Systems    Past Medical History  Diagnosis Date  . Arthritis     bilateral knees  . Sciatica     per patient  . History of chicken pox   . Bipolar disorder   . Type 2 diabetes mellitus 2006    Diet controlled currently, prior on metformin  . Headache(784.0)   . HTN (hypertension)   . HLD (hyperlipidemia)   . History of kidney stones 2007    s/p lithotripsy, 2 currently, stable, has seen uro in past  . Migraines     prior on maxalt  . History of rheumatic fever     with residual murmur  . Vitamin D deficiency   . OSA (obstructive sleep apnea)     hx this, was on CPAP  . Asthma     Family History  Problem Relation Age of Onset  . Bipolar disorder Mother   . Diabetes Mother   . Hyperlipidemia Mother   . Hypertension Mother   . Glaucoma Mother   . Stroke Mother   . Coronary artery disease Mother   . Glaucoma Father   . Thyroid disease Father   . Cancer Paternal Aunt     pancreatic  . Cancer Paternal Grandfather     bone  . Rheum arthritis Mother   . Aneurysm Sister 56    brain, deceased    History   Social History  . Marital Status: Single    Spouse Name: N/A    Number of Children: N/A  . Years of Education: N/A   Occupational History  . Not on file.   Social History Main Topics  . Smoking status: Current Some Day Smoker    Types: Cigars  . Smokeless tobacco: Never Used     Comment: 1 cigar 3-4 x/week  . Alcohol Use: Yes     Comment: occasional  . Drug Use: No  . Sexual Activity:    Partners: Male    Birth Control/ Protection: Surgical     Comment: uterine ablation/tn   Other Topics Concern  . Not on file   Social History Narrative   Caffeine: dark sodas occasionally   Lives with daughter, 1 dog   Occupation: disability from bipolar, looking for part time job    Edu: HS   Activity: walking - not recently, some cardio at home   Diet: good water, occasional fruits/vegetables  Allergies  Allergen Reactions  . Ciprofloxacin Anaphylaxis  . Penicillins Anaphylaxis  . Sulfa Antibiotics Anaphylaxis    Thinks has taken bactrim in past...  . Prednisone Rash     Constitutional: Positive headache, fatigue and fever. Denies abrupt weight changes.  HEENT:  Positive eye pain, pressure behind the eyes, facial pain, nasal congestion and sore throat. Denies eye redness, ear pain, ringing in the ears, wax buildup, runny nose or bloody nose. Respiratory: Positive cough. Denies difficulty breathing or shortness of breath.  Cardiovascular: Denies chest pain, chest tightness, palpitations or swelling in the hands or feet.   No other specific complaints in a complete review of systems (except as listed in HPI above).  Objective:    General: Appears his stated age, well developed, well nourished in NAD. HEENT: Head: normal shape and size, sinus tenderness noted; Eyes: sclera white, no icterus, conjunctiva pink, PERRLA and EOMs intact; Ears: Tm's gray and intact, normal light reflex; Nose: mucosa pink and moist, septum midline; Throat/Mouth: + PND. Teeth present, mucosa pink and moist, no exudate noted, no lesions or ulcerations noted.  Neck: Neck supple, trachea midline. No massses, lumps or thyromegaly present.  Cardiovascular: Normal rate and rhythm. S1,S2 noted.  No murmur, rubs or gallops noted. No JVD or BLE edema. No carotid bruits noted. Pulmonary/Chest: Normal effort and positive vesicular breath sounds. No respiratory distress. No wheezes, rales or ronchi noted.      Assessment & Plan:   Acute bacterial sinusitis  Can use a Neti Pot which can be purchased from your local drug store. Flonase 2 sprays each nostril for 3 days and then as needed. Augmentin BID for 10 days  RTC as needed or if symptoms persist.      Assessment & Plan:

## 2014-08-28 NOTE — Progress Notes (Signed)
Subjective:    Patient ID: Allison Schmitt, female    DOB: 27-Dec-1970, 44 y.o.   MRN: [id number]  Vaginal Discharge The patient's primary symptoms include vaginal discharge.   Ms. Laroche is a 44 y.o. female presenting to the clinic today with c/o brownish vaginal discharge and odor x 2-3 weeks. She recently finished her menstrual cycle 2 days ago so is unsure if brown discharge could be due to that but has been smelling a strong odor. She has had lower abdominal pain but unsure if it may be related to menstrual symptoms. H/O of recurrent BV. Last seen here 05/10/14. Thinks she may have it now due to odor. No itching. Denies dysuria, urgency, frequency. Last sexual encounter 1 month ago. No new partners.   H/O uterine ablation and BTL. Hasn't taken any probiotics. LMP ended 2 days ago.  Review of Systems  Genitourinary: Positive for vaginal discharge.       Past Medical History  Diagnosis Date  . Arthritis     bilateral knees  . Sciatica     per patient  . History of chicken pox   . Bipolar disorder   . Type 2 diabetes mellitus 2006    Diet controlled currently, prior on metformin  . Headache(784.0)   . HTN (hypertension)   . HLD (hyperlipidemia)   . History of kidney stones 2007    s/p lithotripsy, 2 currently, stable, has seen uro in past  . Migraines     prior on maxalt  . History of rheumatic fever     with residual murmur  . Vitamin D deficiency   . OSA (obstructive sleep apnea)     hx this, was on CPAP  . Asthma     Current Outpatient Prescriptions  Medication Sig Dispense Refill  . ACCU-CHEK SOFTCLIX LANCETS lancets Use every other day 100 each prn  . citalopram (CELEXA) 40 MG tablet Take 1 tablet (40 mg total) by mouth daily. 90 tablet 3  . cyclobenzaprine (FLEXERIL) 5 MG tablet Take 1 tablet (5 mg total) by mouth 3 (three) times daily as needed for muscle spasms. 30 tablet 1  . glucose blood test strip Use every other day - Accu-Chek 50 each prn  .  lisinopril (PRINIVIL,ZESTRIL) 40 MG tablet Take 1 tablet (40 mg total) by mouth daily. 90 tablet 3  . metroNIDAZOLE (FLAGYL) 500 MG tablet Take 1 tablet (500 mg total) by mouth 2 (two) times daily. 14 tablet 0  . fluconazole (DIFLUCAN) 150 MG tablet Take 1 tablet (150 mg total) by mouth once. 1 tablet 0  . metroNIDAZOLE (METROGEL) 0.75 % vaginal gel Place 1 Applicatorful vaginally 2 (two) times daily. 70 g 0   No current facility-administered medications for this visit.    Allergies  Allergen Reactions  . Ciprofloxacin Anaphylaxis  . Penicillins Anaphylaxis  . Sulfa Antibiotics Anaphylaxis    Thinks has taken bactrim in past...  . Prednisone Rash    Family History  Problem Relation Age of Onset  . Bipolar disorder Mother   . Diabetes Mother   . Hyperlipidemia Mother   . Hypertension Mother   . Glaucoma Mother   . Stroke Mother   . Coronary artery disease Mother   . Glaucoma Father   . Thyroid disease Father   . Cancer Paternal Aunt     pancreatic  . Cancer Paternal Grandfather     bone  . Rheum arthritis Mother   . Aneurysm Sister 21    brain, deceased  History   Social History  . Marital Status: Single    Spouse Name: N/A    Number of Children: N/A  . Years of Education: N/A   Occupational History  . Not on file.   Social History Main Topics  . Smoking status: Current Some Day Smoker    Types: Cigars  . Smokeless tobacco: Never Used     Comment: 1 cigar 3-4 x/week  . Alcohol Use: Yes     Comment: occasional  . Drug Use: No  . Sexual Activity:    Partners: Male    Birth Control/ Protection: Surgical     Comment: uterine ablation/tn   Other Topics Concern  . Not on file   Social History Narrative   Caffeine: dark sodas occasionally   Lives with daughter, 1 dog   Occupation: disability from bipolar, looking for part time job   Edu: HS   Activity: walking - not recently, some cardio at home   Diet: good water, occasional fruits/vegetables     Constitutional: Denies nausea, vomiting, fever, malaise, fatigue, headache or abrupt weight changes.  Respiratory: Denies difficulty breathing, shortness of breath, cough or sputum production.   Cardiovascular: Denies chest pain, chest tightness, palpitations or swelling in the hands or feet.  Gastrointestinal: Denies bloating, constipation, diarrhea or blood in the stool.  GU: Positive mild pelvic pain, vaginal discharge and odor. Denies urgency, frequency, pain with urination, burning sensation, blood in urine.  Skin: Denies redness, rashes, lesions or ulcercations.   No other specific complaints in a complete review of systems (except as listed in HPI above).  Objective:   Physical Exam   BP 110/74 mmHg  Pulse 76  Temp(Src) 98.4 F (36.9 C) (Oral)  Wt 174 lb (78.926 kg)  SpO2 99%  LMP 08/19/2014  General: Appears her stated age, well developed, well nourished in NAD.  Abdominal: Soft. Normal appearance and normal BS. No distension, mass or hepatosplenomegaly. There is no tenderness. There is no ridigity, no rebound, no guarding, no CVA tenderness. Genitourinary: Uterus normal. Pelvic exam was performed w/ patient supine. There is no rash, tenderness, lesion or injury on the labia b/l. Cervix exhibits no motion tenderness, no discharge and no friability.  Right & Left adnexum displays no mass, no tenderness and no fullness. Thin, white vaginal discharge discharge. Mild odor present. Skin: Skin is warm and dry. No rash noted.     Assessment & Plan:   Vaginitis and vulvovaginitis:  Exam consistent with candida: Wet prep - large yeast, no clue, no trich eRx for Diflucan 150 mg PO x 1, may repeat in 3 days if needed.  Despite no clue cells, due to her concern for odor, mild abd discomfort and H/O of recurrent bacterial vaginosis: If no improvement in symptoms, treat w/ 0.75 metrogel for 5 days- RX provided  Recommend condom use. Denies concerns for concurrent STDs.

## 2014-10-01 ENCOUNTER — Encounter: Payer: Self-pay | Admitting: Family Medicine

## 2014-10-02 MED ORDER — METRONIDAZOLE 500 MG PO TABS: 500.0000 mg | ORAL_TABLET | Freq: Two times a day (BID) | ORAL | Status: DC

## 2014-10-09 ENCOUNTER — Ambulatory Visit (INDEPENDENT_AMBULATORY_CARE_PROVIDER_SITE_OTHER): Payer: Medicare Other | Admitting: Family Medicine

## 2014-10-09 ENCOUNTER — Encounter: Payer: Self-pay | Admitting: Family Medicine

## 2014-10-09 VITALS — BP 110/72 | HR 91 | Temp 98.8°F | Wt 174.8 lb

## 2014-10-09 DIAGNOSIS — G4489 Other headache syndrome: Secondary | ICD-10-CM | POA: Diagnosis not present

## 2014-10-09 MED ORDER — GABAPENTIN 100 MG PO CAPS: 100.0000 mg | ORAL_CAPSULE | Freq: Three times a day (TID) | ORAL | Status: DC

## 2014-10-09 MED ORDER — CYCLOBENZAPRINE HCL 5 MG PO TABS: 5.0000 mg | ORAL_TABLET | Freq: Every day | ORAL | Status: DC

## 2014-10-09 NOTE — Patient Instructions (Signed)
Try taking flexeril if your neck muscles feel tight.  Gradually increase the dose of gabapentin for the pain.  Take care.

## 2014-10-09 NOTE — Progress Notes (Signed)
Pre visit review using our clinic review tool, if applicable. No additional management support is needed unless otherwise documented below in the visit note.  Headaches.  She usually doesn't have a lot of HA at baseline, unless time of high stress.   Recently with inc anxiety/stress with family members in town.   R side of head was hurting about 2 weeks ago, around the same time as family visiting.  More pain with neck flexion and extension.   Zero pain on the L side.   R sided pain is constant but sometimes worse than others.   Feeling of R ear pressure recently.   Slight photophobia, slightly phonophobia. Laying down in a dark room can help the HA some.   No vomiting.  No nausea.   Throbbing HA.   No known h/o migraine dx.   Tried otc cold meds w/o relief.   Pain starts in the occiput and comes up/around the right side of the head.    Recently with palpitations.  Smoking some.  Minimal caffeine.  No illicits.  No LOC.  They can self resolve.  Encouraged smoking cessation.   Meds, vitals, and allergies reviewed.   ROS: See HPI.  Otherwise, noncontributory.  GEN: nad, alert and oriented HEENT: mucous membranes moist, tm wnl, nasal and OP exam wnl PERRL EOMI NECK: supple w/o LA but occiput ttp (but not in midline) CV: rrr.  no murmur PULM: ctab, no inc wob EXT: no edema SKIN: no acute rash CN 2-12 wnl B, S/S/DTR wnl x4

## 2014-10-10 DIAGNOSIS — R519 Headache, unspecified: Secondary | ICD-10-CM | POA: Insufficient documentation

## 2014-10-10 DIAGNOSIS — R51 Headache: Secondary | ICD-10-CM

## 2014-10-10 NOTE — Assessment & Plan Note (Signed)
Likely occipital neuralgia with possible migraine component.   Will try taking flexeril if her neck muscles feel tight.  Gradually increase the dose of gabapentin for the pain.   She agrees.  F/u prn.  No emergent sx.  Anatomy d/w pt.   Noted h/o palpitations.  She has f/u with PCP pending and with normal cardiac exam today, no sx and no CP, defer to PCP. She agrees.

## 2014-10-24 ENCOUNTER — Other Ambulatory Visit: Payer: Self-pay | Admitting: Family Medicine

## 2014-10-24 DIAGNOSIS — R7303 Prediabetes: Secondary | ICD-10-CM

## 2014-10-24 DIAGNOSIS — I10 Essential (primary) hypertension: Secondary | ICD-10-CM

## 2014-10-24 DIAGNOSIS — F3170 Bipolar disorder, currently in remission, most recent episode unspecified: Secondary | ICD-10-CM

## 2014-10-24 DIAGNOSIS — E785 Hyperlipidemia, unspecified: Secondary | ICD-10-CM

## 2014-10-25 ENCOUNTER — Other Ambulatory Visit (INDEPENDENT_AMBULATORY_CARE_PROVIDER_SITE_OTHER): Payer: Medicare Other

## 2014-10-25 DIAGNOSIS — Z8639 Personal history of other endocrine, nutritional and metabolic disease: Secondary | ICD-10-CM

## 2014-10-25 DIAGNOSIS — R7303 Prediabetes: Secondary | ICD-10-CM

## 2014-10-25 DIAGNOSIS — I10 Essential (primary) hypertension: Secondary | ICD-10-CM

## 2014-10-25 DIAGNOSIS — F3170 Bipolar disorder, currently in remission, most recent episode unspecified: Secondary | ICD-10-CM | POA: Diagnosis not present

## 2014-10-25 DIAGNOSIS — R7309 Other abnormal glucose: Secondary | ICD-10-CM

## 2014-10-25 DIAGNOSIS — R7989 Other specified abnormal findings of blood chemistry: Secondary | ICD-10-CM | POA: Diagnosis not present

## 2014-10-25 DIAGNOSIS — E785 Hyperlipidemia, unspecified: Secondary | ICD-10-CM | POA: Diagnosis not present

## 2014-10-25 LAB — COMPREHENSIVE METABOLIC PANEL WITH GFR
ALT: 16 U/L (ref 0–35)
AST: 17 U/L (ref 0–37)
Albumin: 4.4 g/dL (ref 3.5–5.2)
Alkaline Phosphatase: 35 U/L — ABNORMAL LOW (ref 39–117)
BUN: 13 mg/dL (ref 6–23)
CO2: 26 meq/L (ref 19–32)
Calcium: 9.7 mg/dL (ref 8.4–10.5)
Chloride: 102 meq/L (ref 96–112)
Creatinine, Ser: 0.82 mg/dL (ref 0.40–1.20)
GFR: 98.12 mL/min
Glucose, Bld: 154 mg/dL — ABNORMAL HIGH (ref 70–99)
Potassium: 4.2 meq/L (ref 3.5–5.1)
Sodium: 134 meq/L — ABNORMAL LOW (ref 135–145)
Total Bilirubin: 0.3 mg/dL (ref 0.2–1.2)
Total Protein: 7.3 g/dL (ref 6.0–8.3)

## 2014-10-25 LAB — CBC WITH DIFFERENTIAL/PLATELET
BASOS PCT: 0.5 % (ref 0.0–3.0)
Basophils Absolute: 0 10*3/uL (ref 0.0–0.1)
EOS PCT: 2.3 % (ref 0.0–5.0)
Eosinophils Absolute: 0.1 10*3/uL (ref 0.0–0.7)
HCT: 40.3 % (ref 36.0–46.0)
Hemoglobin: 13.4 g/dL (ref 12.0–15.0)
LYMPHS ABS: 2.3 10*3/uL (ref 0.7–4.0)
LYMPHS PCT: 44.1 % (ref 12.0–46.0)
MCHC: 33.3 g/dL (ref 30.0–36.0)
MCV: 88.5 fl (ref 78.0–100.0)
MONOS PCT: 5.1 % (ref 3.0–12.0)
Monocytes Absolute: 0.3 10*3/uL (ref 0.1–1.0)
NEUTROS ABS: 2.5 10*3/uL (ref 1.4–7.7)
NEUTROS PCT: 48 % (ref 43.0–77.0)
Platelets: 281 10*3/uL (ref 150.0–400.0)
RBC: 4.55 Mil/uL (ref 3.87–5.11)
RDW: 14.4 % (ref 11.5–15.5)
WBC: 5.2 10*3/uL (ref 4.0–10.5)

## 2014-10-25 LAB — LIPID PANEL
Cholesterol: 213 mg/dL — ABNORMAL HIGH (ref 0–200)
HDL: 32.4 mg/dL — ABNORMAL LOW
Total CHOL/HDL Ratio: 7
Triglycerides: 501 mg/dL — ABNORMAL HIGH (ref 0.0–149.0)

## 2014-10-25 LAB — TSH: TSH: 1.46 u[IU]/mL (ref 0.35–4.50)

## 2014-10-25 LAB — VITAMIN D 25 HYDROXY (VIT D DEFICIENCY, FRACTURES): VITD: 19.12 ng/mL — AB (ref 30.00–100.00)

## 2014-10-25 LAB — HEMOGLOBIN A1C: HEMOGLOBIN A1C: 6.7 % — AB (ref 4.6–6.5)

## 2014-10-25 LAB — LDL CHOLESTEROL, DIRECT: LDL DIRECT: 91 mg/dL

## 2014-10-30 ENCOUNTER — Encounter: Payer: Self-pay | Admitting: Family Medicine

## 2014-10-30 ENCOUNTER — Ambulatory Visit (INDEPENDENT_AMBULATORY_CARE_PROVIDER_SITE_OTHER): Payer: Medicare Other | Admitting: Family Medicine

## 2014-10-30 ENCOUNTER — Other Ambulatory Visit (HOSPITAL_COMMUNITY)
Admission: RE | Admit: 2014-10-30 | Discharge: 2014-10-30 | Disposition: A | Discharge status: Home / Self Care | Payer: Medicare Other | Source: Ambulatory Visit | Attending: Family Medicine | Admitting: Family Medicine

## 2014-10-30 VITALS — BP 118/66 | HR 84 | Temp 98.3°F | Wt 173.2 lb

## 2014-10-30 DIAGNOSIS — Z124 Encounter for screening for malignant neoplasm of cervix: Secondary | ICD-10-CM | POA: Diagnosis not present

## 2014-10-30 DIAGNOSIS — N760 Acute vaginitis: Secondary | ICD-10-CM | POA: Insufficient documentation

## 2014-10-30 DIAGNOSIS — Z0000 Encounter for general adult medical examination without abnormal findings: Secondary | ICD-10-CM

## 2014-10-30 DIAGNOSIS — I10 Essential (primary) hypertension: Secondary | ICD-10-CM

## 2014-10-30 DIAGNOSIS — E785 Hyperlipidemia, unspecified: Secondary | ICD-10-CM

## 2014-10-30 DIAGNOSIS — E119 Type 2 diabetes mellitus without complications: Secondary | ICD-10-CM

## 2014-10-30 DIAGNOSIS — Z113 Encounter for screening for infections with a predominantly sexual mode of transmission: Secondary | ICD-10-CM | POA: Diagnosis not present

## 2014-10-30 DIAGNOSIS — F3170 Bipolar disorder, currently in remission, most recent episode unspecified: Secondary | ICD-10-CM

## 2014-10-30 DIAGNOSIS — E559 Vitamin D deficiency, unspecified: Secondary | ICD-10-CM

## 2014-10-30 DIAGNOSIS — Z1151 Encounter for screening for human papillomavirus (HPV): Secondary | ICD-10-CM | POA: Insufficient documentation

## 2014-10-30 LAB — HM PAP SMEAR: HM PAP: NEGATIVE

## 2014-10-30 MED ORDER — VITAMIN D 50 MCG (2000 UT) PO CAPS: 1.0000 | ORAL_CAPSULE | Freq: Every day | ORAL | Status: DC

## 2014-10-30 NOTE — Addendum Note (Signed)
Addended by: Josph MachoANCE, KIMBERLY A on: 10/30/2014 12:41 PM   Modules accepted: Orders

## 2014-10-30 NOTE — Progress Notes (Signed)
BP 118/66 mmHg  Pulse 84  Temp(Src) 98.3 F (36.8 C) (Oral)  Wt 173 lb 4 oz (78.586 kg)  LMP 10/18/2014   CC: CPE  Subjective:    Patient ID: Allison Schmitt, female    DOB: [birth year], 44 y.o.   MRN: [id number]  HPI: Allison Schmitt is a 44 y.o. female presenting on 10/30/2014 for Annual Exam and vaginal odor   H/o recurrent BV, h/o trich in past.  Bipolar - off mood stabilizer (caused excessive fatigue), stable on celexa  daily.  Has tried several different mood stabilizers. risperdal excessively sedating.   Smoking - down to 1 cigar/day. Cough improved off cigarettes.  Preventative: Pap smear normal 12/2011. normal in last 10 yrs. Rpt today. Breast exam today. First mammogram 02/2013.No fmhx breast cancer that patient knows of. Pneumovax 2008.  Tdap 2008.  Seat belt use discussed. Sunscreen use discussed. No recent sunburns. H/o uterine ablation and BTL.  LMP 10/18/2014  Caffeine: has cut down on sodas  Lives with daughter, 1 dog  Occupation: disability from bipolar, looking for part time job  Edu: HS  Activity: walking - not recently, some cardio at home  Diet: good water, occasional fruits/vegetables   Relevant past medical, surgical, family and social history reviewed and updated as indicated. Interim medical history since our last visit reviewed. Allergies and medications reviewed and updated. Current Outpatient Prescriptions on File Prior to Visit  Medication Sig  . ACCU-CHEK SOFTCLIX LANCETS lancets Use every other day  . aspirin 81 MG tablet Take 81 mg by mouth daily.  . citalopram (CELEXA) 40 MG tablet Take 1 tablet (40 mg total) by mouth daily.  . cyclobenzaprine (FLEXERIL) 5 MG tablet Take 1 tablet (5 mg total) by mouth at bedtime.  . gabapentin (NEURONTIN) 100 MG capsule Take 1 capsule (100 mg total) by mouth 3 (three) times daily.  Marland Kitchen glucose blood test strip Use every other day - Accu-Chek  . lisinopril (PRINIVIL,ZESTRIL) 40 MG tablet Take 1  tablet (40 mg total) by mouth daily.   No current facility-administered medications on file prior to visit.    Review of Systems  Constitutional: Negative for fever, chills, activity change, appetite change, fatigue and unexpected weight change.  HENT: Negative for hearing loss.   Eyes: Negative for visual disturbance.  Respiratory: Negative for cough, chest tightness, shortness of breath and wheezing.   Cardiovascular: Negative for chest pain, palpitations and leg swelling.  Gastrointestinal: Negative for nausea, vomiting, abdominal pain, diarrhea, constipation, blood in stool and abdominal distention.  Genitourinary: Negative for hematuria and difficulty urinating.  Musculoskeletal: Negative for myalgias, arthralgias and neck pain.  Skin: Negative for rash.  Neurological: Negative for dizziness, seizures, syncope and headaches.  Hematological: Negative for adenopathy. Does not bruise/bleed easily.  Psychiatric/Behavioral: Negative for dysphoric mood. The patient is not nervous/anxious.    Per HPI unless specifically indicated above     Objective:    BP 118/66 mmHg  Pulse 84  Temp(Src) 98.3 F (36.8 C) (Oral)  Wt 173 lb 4 oz (78.586 kg)  LMP 10/18/2014  Wt Readings from Last 3 Encounters:  10/30/14 173 lb 4 oz (78.586 kg)  10/09/14 174 lb 12 oz (79.266 kg)  08/28/14 174 lb (78.926 kg)    Physical Exam  Constitutional: She is oriented to person, place, and time. She appears well-developed and well-nourished. No distress.  HENT:  Head: Normocephalic and atraumatic.  Right Ear: Hearing, tympanic membrane, external ear and ear canal normal.  Left Ear: Hearing,  tympanic membrane, external ear and ear canal normal.  Nose: Nose normal.  Mouth/Throat: Uvula is midline, oropharynx is clear and moist and mucous membranes are normal. No oropharyngeal exudate, posterior oropharyngeal edema or posterior oropharyngeal erythema.  Eyes: Conjunctivae and EOM are normal. Pupils are equal,  round, and reactive to light. No scleral icterus.  Neck: Normal range of motion. Neck supple. No thyromegaly present.  Cardiovascular: Normal rate, regular rhythm, normal heart sounds and intact distal pulses.   No murmur heard. Pulses:      Radial pulses are 2+ on the right side, and 2+ on the left side.  Pulmonary/Chest: Effort normal and breath sounds normal. No respiratory distress. She has no wheezes. She has no rales. Right breast exhibits no inverted nipple, no mass, no nipple discharge and no skin change. Left breast exhibits no inverted nipple, no mass, no nipple discharge and no skin change.  Abdominal: Soft. Bowel sounds are normal. She exhibits no distension and no mass. There is no tenderness. There is no rebound and no guarding.  Genitourinary: Vagina normal and uterus normal. Pelvic exam was performed with patient supine. There is no rash, tenderness or lesion on the right labia. There is no rash, tenderness or lesion on the left labia. Cervix exhibits no motion tenderness, no discharge and no friability. Right adnexum displays no mass, no tenderness and no fullness. Left adnexum displays no mass, no tenderness and no fullness.  Musculoskeletal: Normal range of motion. She exhibits no edema.  Lymphadenopathy:       Head (right side): No submental, no submandibular, no tonsillar, no preauricular and no posterior auricular adenopathy present.       Head (left side): No submental, no submandibular, no tonsillar, no preauricular and no posterior auricular adenopathy present.    She has no cervical adenopathy.    She has no axillary adenopathy.       Right axillary: No lateral adenopathy present.       Left axillary: No lateral adenopathy present.      Right: No supraclavicular adenopathy present.       Left: No supraclavicular adenopathy present.  Neurological: She is alert and oriented to person, place, and time.  CN grossly intact, station and gait intact  Skin: Skin is warm and  dry. No rash noted.  Psychiatric: She has a normal mood and affect. Her behavior is normal. Judgment and thought content normal.  Nursing note and vitals reviewed.  Results for orders placed or performed in visit on 10/25/14  Hemoglobin A1c  Result Value Ref Range   Hgb A1c MFr Bld 6.7 (H) 4.6 - 6.5 %  Lipid panel  Result Value Ref Range   Cholesterol 213 (H) 0 - 200 mg/dL   Triglycerides (H) 0.0 - 149.0 mg/dL    098.1501.0 Triglyceride is over 400; calculations on Lipids are invalid.   HDL 32.40 (L) >39.00 mg/dL   Total CHOL/HDL Ratio 7   Comprehensive metabolic panel  Result Value Ref Range   Sodium 134 (L) 135 - 145 mEq/L   Potassium 4.2 3.5 - 5.1 mEq/L   Chloride 102 96 - 112 mEq/L   CO2 26 19 - 32 mEq/L   Glucose, Bld 154 (H) 70 - 99 mg/dL   BUN 13 6 - 23 mg/dL   Creatinine, Ser 1.910.82 0.40 - 1.20 mg/dL   Total Bilirubin 0.3 0.2 - 1.2 mg/dL   Alkaline Phosphatase 35 (L) 39 - 117 U/L   AST 17 0 - 37 U/L  ALT 16 0 - 35 U/L   Total Protein 7.3 6.0 - 8.3 g/dL   Albumin 4.4 3.5 - 5.2 g/dL   Calcium 9.7 8.4 - 09.8 mg/dL   GFR 11.91 >47.82 mL/min  TSH  Result Value Ref Range   TSH 1.46 0.35 - 4.50 uIU/mL  CBC with Differential/Platelet  Result Value Ref Range   WBC 5.2 4.0 - 10.5 K/uL   RBC 4.55 3.87 - 5.11 Mil/uL   Hemoglobin 13.4 12.0 - 15.0 g/dL   HCT 95.6 21.3 - 08.6 %   MCV 88.5 78.0 - 100.0 fl   MCHC 33.3 30.0 - 36.0 g/dL   RDW 57.8 46.9 - 62.9 %   Platelets 281.0 150.0 - 400.0 K/uL   Neutrophils Relative % 48.0 43.0 - 77.0 %   Lymphocytes Relative 44.1 12.0 - 46.0 %   Monocytes Relative 5.1 3.0 - 12.0 %   Eosinophils Relative 2.3 0.0 - 5.0 %   Basophils Relative 0.5 0.0 - 3.0 %   Neutro Abs 2.5 1.4 - 7.7 K/uL   Lymphs Abs 2.3 0.7 - 4.0 K/uL   Monocytes Absolute 0.3 0.1 - 1.0 K/uL   Eosinophils Absolute 0.1 0.0 - 0.7 K/uL   Basophils Absolute 0.0 0.0 - 0.1 K/uL  Vit D  25 hydroxy (rtn osteoporosis monitoring)  Result Value Ref Range   VITD 19.12 (L) 30.00 -  100.00 ng/mL  LDL cholesterol, direct  Result Value Ref Range   Direct LDL 91.0 mg/dL      Assessment & Plan:   Problem List Items Addressed This Visit    Vitamin d deficiency    rec restart 2000 IU daily.      Vaginitis and vulvovaginitis    Given h/o recurrent infections will check today for BV/yeast and trich. CT/GC with pap per pt request.      HTN (hypertension)    Chronic, stable. Continue lisinorpil  daily.      HLD (hyperlipidemia)    Reviewed #s. Off pravastatin. Trig markedly elevated at 500! Return on Thursday for recheck chol levels.      Relevant Orders   Lipid panel   Healthcare maintenance - Primary    Preventative protocols reviewed and updated unless pt declined. Discussed healthy diet and lifestyle.       Diet-controlled diabetes mellitus    Reviewed A1c - discussed avoiding added sugars in diet.      Bipolar disorder    Stable off mood stabilizer          Follow up plan: Return in about 1 year (around 10/30/2015), or as needed, for annual exam, prior fasting for blood work.

## 2014-10-30 NOTE — Progress Notes (Signed)
Pre visit review using our clinic review tool, if applicable. No additional management support is needed unless otherwise documented below in the visit note. 

## 2014-10-30 NOTE — Assessment & Plan Note (Signed)
rec restart 2000 IU daily. 

## 2014-10-30 NOTE — Assessment & Plan Note (Signed)
Reviewed #s. Off pravastatin. Trig markedly elevated at 500! Return on Thursday for recheck chol levels.

## 2014-10-30 NOTE — Patient Instructions (Addendum)
Restart vitamin D 2000 units daily. Return on Thursday to recheck cholesterol levels. Breast exam and pelvic/ pap performed today. Wet prep sent as well.

## 2014-10-30 NOTE — Assessment & Plan Note (Signed)
Chronic, stable. Continue lisinorpil 40mg  daily.

## 2014-10-30 NOTE — Assessment & Plan Note (Signed)
Stable off mood stabilizer

## 2014-10-30 NOTE — Assessment & Plan Note (Signed)
Preventative protocols reviewed and updated unless pt declined. Discussed healthy diet and lifestyle.  

## 2014-10-30 NOTE — Assessment & Plan Note (Signed)
Given h/o recurrent infections will check today for BV/yeast and trich. CT/GC with pap per pt request.

## 2014-10-30 NOTE — Assessment & Plan Note (Signed)
Reviewed A1c - discussed avoiding added sugars in diet.

## 2014-10-31 LAB — CERVICOVAGINAL ANCILLARY ONLY: Candida vaginitis: NEGATIVE

## 2014-11-01 ENCOUNTER — Other Ambulatory Visit (INDEPENDENT_AMBULATORY_CARE_PROVIDER_SITE_OTHER): Payer: Medicare Other

## 2014-11-01 DIAGNOSIS — E785 Hyperlipidemia, unspecified: Secondary | ICD-10-CM | POA: Diagnosis not present

## 2014-11-01 LAB — LIPID PANEL
CHOL/HDL RATIO: 5
Cholesterol: 215 mg/dL — ABNORMAL HIGH (ref 0–200)
HDL: 39.9 mg/dL (ref >39.00)
NonHDL: 175.1
TRIGLYCERIDES: 226 mg/dL — AB (ref 0.0–149.0)
VLDL: 45.2 mg/dL — AB (ref 0.0–40.0)

## 2014-11-01 LAB — LDL CHOLESTEROL, DIRECT: Direct LDL: 132 mg/dL

## 2014-11-01 LAB — CYTOLOGY - PAP

## 2014-11-05 ENCOUNTER — Encounter: Payer: Self-pay | Admitting: *Deleted

## 2014-11-05 ENCOUNTER — Other Ambulatory Visit: Payer: Self-pay | Admitting: Family Medicine

## 2014-11-05 MED ORDER — CLINDAMYCIN HCL 300 MG PO CAPS: 300.0000 mg | ORAL_CAPSULE | Freq: Two times a day (BID) | ORAL | Status: DC

## 2014-11-16 DIAGNOSIS — E119 Type 2 diabetes mellitus without complications: Secondary | ICD-10-CM | POA: Diagnosis not present

## 2014-11-23 ENCOUNTER — Ambulatory Visit (INDEPENDENT_AMBULATORY_CARE_PROVIDER_SITE_OTHER)
Admission: RE | Admit: 2014-11-23 | Discharge: 2014-11-23 | Disposition: A | Discharge status: Home / Self Care | Payer: Medicare Other | Source: Ambulatory Visit | Attending: Family Medicine | Admitting: Family Medicine

## 2014-11-23 ENCOUNTER — Encounter: Payer: Self-pay | Admitting: Family Medicine

## 2014-11-23 ENCOUNTER — Ambulatory Visit (INDEPENDENT_AMBULATORY_CARE_PROVIDER_SITE_OTHER): Payer: Medicare Other | Admitting: Family Medicine

## 2014-11-23 VITALS — BP 104/74 | HR 82 | Temp 98.7°F | Wt 172.2 lb

## 2014-11-23 DIAGNOSIS — N390 Urinary tract infection, site not specified: Secondary | ICD-10-CM | POA: Insufficient documentation

## 2014-11-23 DIAGNOSIS — N3001 Acute cystitis with hematuria: Secondary | ICD-10-CM | POA: Diagnosis not present

## 2014-11-23 DIAGNOSIS — R300 Dysuria: Secondary | ICD-10-CM

## 2014-11-23 DIAGNOSIS — N2889 Other specified disorders of kidney and ureter: Secondary | ICD-10-CM | POA: Diagnosis not present

## 2014-11-23 LAB — POCT URINALYSIS DIPSTICK
Bilirubin, UA: NEGATIVE
Glucose, UA: NEGATIVE
Ketones, UA: 0.5
Nitrite, UA: NEGATIVE
Spec Grav, UA: >=1.03
UROBILINOGEN UA: 4
pH, UA: 6

## 2014-11-23 MED ORDER — NITROFURANTOIN MONOHYD MACRO 100 MG PO CAPS: 100.0000 mg | ORAL_CAPSULE | Freq: Two times a day (BID) | ORAL | Status: DC

## 2014-11-23 NOTE — Progress Notes (Signed)
Pre visit review using our clinic review tool, if applicable. No additional management support is needed unless otherwise documented below in the visit note.  Dysuria:yes, burning with urination duration of symptoms: 3 days abdominal pain: yes, pelvic pressure Fevers: possible fever prev, not in last 24 hours back pain: prev some, much less now.   Vomiting: no other concerns:h/o mult med allergies.   She has had some R sided flank pain.  She has a h/o stones.    Meds, vitals, and allergies reviewed.   ROS: See HPI.  Otherwise negative.    GEN: nad, alert and oriented HEENT: mucous membranes moist NECK: supple CV: rrr.  PULM: ctab, no inc wob ABD: soft, +bs, suprapubic area tender- she has some R sided/flank pain when I palpate her suprapubic area but no frank RLQ pain itself EXT: no edema SKIN: no acute rash BACK: no CVA pain

## 2014-11-23 NOTE — Assessment & Plan Note (Signed)
Presumed, no CVA pain.  Nontoxic.   I reviewed the KUB and didn't seen stone to cause her R sided sx.  D/w pt.   ucx pending,  U/a reviewed with patient.  Okay for outpatient fu.   She can tolerate macrobid, that is likely the best abx option.   Start in meantime.  If worse, then update us.  She agrees.

## 2014-11-23 NOTE — Patient Instructions (Signed)
Drink plenty of water and start the antibiotics today.  We'll contact you with your lab and xray report.  Take care.   If you have a profound increase in pain or a fever, then update us.

## 2014-11-26 ENCOUNTER — Telehealth: Payer: Self-pay | Admitting: *Deleted

## 2014-11-26 LAB — URINE CULTURE

## 2014-11-26 NOTE — Telephone Encounter (Signed)
-----   Message from Joaquim NamGraham S Duncan, MD sent at 11/25/2014  8:55 PM EDT ----- I couldn't route the note to you, an EMR problem. Please see result note and call pt.  Thanks.

## 2014-11-26 NOTE — Telephone Encounter (Signed)
Patient was advised about her urine culture results and x-ray.  Patient is feeling better.

## 2014-11-27 NOTE — Telephone Encounter (Signed)
Noted, thanks!

## 2014-12-10 ENCOUNTER — Encounter: Payer: Self-pay | Admitting: Family Medicine

## 2014-12-12 MED ORDER — METRONIDAZOLE 500 MG PO TABS: 500.0000 mg | ORAL_TABLET | Freq: Two times a day (BID) | ORAL | Status: DC

## 2014-12-28 ENCOUNTER — Telehealth: Payer: Self-pay

## 2014-12-28 NOTE — Telephone Encounter (Signed)
I'll defer to PCP on this.  I would prefer her to get rechecked.

## 2014-12-28 NOTE — Telephone Encounter (Signed)
Pt left v/m; pt was seen 11/23/14 with UTI and pt was given abx. Pt having same symptoms again and pt request refill abx to [facility]. Unable to reach pt by phone to get specific symptoms.

## 2014-12-28 NOTE — Telephone Encounter (Signed)
Lm on pts vm and advised

## 2014-12-28 NOTE — Telephone Encounter (Signed)
[facility]

## 2015-01-04 ENCOUNTER — Encounter: Payer: Self-pay | Admitting: Family Medicine

## 2015-01-04 ENCOUNTER — Ambulatory Visit (INDEPENDENT_AMBULATORY_CARE_PROVIDER_SITE_OTHER): Payer: Medicare Other | Admitting: Family Medicine

## 2015-01-04 VITALS — BP 106/80 | HR 83 | Temp 98.5°F | Wt 171.8 lb

## 2015-01-04 DIAGNOSIS — B3731 Acute candidiasis of vulva and vagina: Secondary | ICD-10-CM

## 2015-01-04 DIAGNOSIS — Z202 Contact with and (suspected) exposure to infections with a predominantly sexual mode of transmission: Secondary | ICD-10-CM | POA: Diagnosis not present

## 2015-01-04 DIAGNOSIS — N760 Acute vaginitis: Secondary | ICD-10-CM | POA: Diagnosis not present

## 2015-01-04 DIAGNOSIS — N3000 Acute cystitis without hematuria: Secondary | ICD-10-CM | POA: Diagnosis not present

## 2015-01-04 DIAGNOSIS — N898 Other specified noninflammatory disorders of vagina: Secondary | ICD-10-CM | POA: Diagnosis not present

## 2015-01-04 DIAGNOSIS — R300 Dysuria: Secondary | ICD-10-CM

## 2015-01-04 DIAGNOSIS — B373 Candidiasis of vulva and vagina: Secondary | ICD-10-CM

## 2015-01-04 DIAGNOSIS — B9689 Other specified bacterial agents as the cause of diseases classified elsewhere: Secondary | ICD-10-CM

## 2015-01-04 DIAGNOSIS — A499 Bacterial infection, unspecified: Secondary | ICD-10-CM

## 2015-01-04 LAB — POCT URINALYSIS DIPSTICK
Bilirubin, UA: NEGATIVE
Blood, UA: NEGATIVE
GLUCOSE UA: NEGATIVE
Ketones, UA: NEGATIVE
NITRITE UA: POSITIVE
Protein, UA: NEGATIVE
UROBILINOGEN UA: 0.2
pH, UA: 6

## 2015-01-04 MED ORDER — FLUCONAZOLE 150 MG PO TABS: 150.0000 mg | ORAL_TABLET | Freq: Once | ORAL | Status: DC

## 2015-01-04 MED ORDER — METRONIDAZOLE 500 MG PO TABS
500.0000 mg | ORAL_TABLET | Freq: Two times a day (BID) | ORAL | Status: DC
Start: 2015-01-04 — End: 2015-03-07

## 2015-01-04 MED ORDER — NITROFURANTOIN MONOHYD MACRO 100 MG PO CAPS: 100.0000 mg | ORAL_CAPSULE | Freq: Two times a day (BID) | ORAL | Status: DC

## 2015-01-04 NOTE — Progress Notes (Signed)
Dr. Karleen Hampshire T. Florenda Watt, MD, CAQ [facility] [address] [place], [address] Phone: [phone] Fax: [phone]  01/04/2015  Patient: Allison Schmitt, MRN: [id number], DOB: Jul 04, 1971, 44 y.o.  Primary Physician:  Eustaquio Boyden, MD  Chief Complaint: Vaginal Discharge  Subjective:   Allison Schmitt is a 44 y.o. very pleasant female patient who presents with the following:  Patient presents with multiple issues.  She is having some vaginal discharge and odor, and she has a history of having multiple episodes of BV in the past.  She also has 2 ongoing sexual partners, and she has not had use of condoms routinely.  She is concerns about potential STDs.  She also has dysuria and urgency with urination, and this is been ongoing and worsening over the last week.  Last week she did have some low-grade fever.  No back pain or CVA tenderness.  Past Medical History, Surgical History, Social History, Family History, Problem List, Medications, and Allergies have been reviewed and updated if relevant.  Patient Active Problem List   Diagnosis Date Noted  . UTI (urinary tract infection) 11/23/2014  . Headache 10/10/2014  . Vaginitis and vulvovaginitis 11/09/2013  . Hair loss 06/19/2013  . Pustule of nostril 05/03/2013  . Abdominal pressure 05/03/2013  . Anxiety 04/19/2012  . Healthcare maintenance 01/22/2012  . Vitamin D deficiency   . Bacterial vaginosis 11/19/2011  . Asthma   . Bipolar disorder   . Diet-controlled diabetes mellitus   . HTN (hypertension)   . HLD (hyperlipidemia)   . Migraines     Past Medical History  Diagnosis Date  . Arthritis     bilateral knees  . Sciatica     per patient  . History of chicken pox   . Bipolar disorder   . Type 2 diabetes mellitus 2006    Diet controlled currently, prior on metformin  . Headache(784.0)   . HTN (hypertension)   . HLD (hyperlipidemia)   . History of kidney stones 2007    s/p lithotripsy, 2 currently, stable, has seen uro in past  . Migraines     prior on maxalt  . History of rheumatic fever     with residual murmur  . Vitamin D deficiency   . OSA (obstructive sleep apnea)     hx this, was on CPAP  . Asthma     Past Surgical History  Procedure Laterality Date  . Kidney stone surgery  1989    lithotripsy  . Tubal ligation  1997  . Knee surgery  2009    Right  . Uterine ablation  2010    dysmenorrhea and menorrhagia    History   Social History  . Marital Status: Single    Spouse Name: N/A  . Number of Children: N/A  . Years of Education: N/A   Occupational History  . Not on file.   Social History Main Topics  . Smoking status: Current Some Day Smoker    Types: Cigars  . Smokeless tobacco: Never Used     Comment: 1 cigar 3-4 x/week  . Alcohol Use: Yes     Comment: occasional  . Drug Use: No  . Sexual Activity:    Partners: Male    Birth Control/ Protection: Surgical     Comment: uterine ablation/tn   Other Topics Concern  . Not on file   Social History Narrative   Caffeine: dark sodas occasionally   Lives with daughter, 1  dog   Occupation: disability from bipolar, looking for part time job   Edu: HS   Activity: walking - not recently, some cardio at home   Diet: good water, occasional fruits/vegetables    Family History  Problem Relation Age of Onset  . Bipolar disorder Mother   . Diabetes Mother   . Hyperlipidemia Mother   . Hypertension Mother   . Glaucoma Mother   . Stroke Mother   . Coronary artery disease Mother   . Glaucoma Father   . Thyroid disease Father   . Cancer Paternal Aunt     pancreatic  . Cancer Paternal Grandfather     bone  . Rheum arthritis Mother   . Aneurysm Sister 46    brain, deceased    Allergies  Allergen Reactions  . Ciprofloxacin Anaphylaxis  . Penicillins Anaphylaxis  . Sulfa Antibiotics Anaphylaxis    Thinks has taken bactrim in past...  . Prednisone Rash    Medication  list reviewed and updated in full in [facility].   ROS: GEN: Acute illness details above GI: Tolerating PO intake GU: maintaining adequate hydration and urination Pulm: No SOB Interactive and getting along well at home.  Otherwise, ROS is as per the HPI.   Objective:   BP 106/80 mmHg  Pulse 83  Temp(Src) 98.5 F (36.9 C) (Oral)  Wt 171 lb 12 oz (77.905 kg)  SpO2 97%   GEN: WDWN, A&Ox4,NAD. Non-toxic HEENT: Atraumatc, normocephalic. CV: RRR, No M/G/R PULM: CTA B, No wheezes, crackles, or rhonchi ABD: S, NT, ND, +BS, no rebound. No CVAT. No suprapubic tenderness. EXT: No c/c/e  GU: NT cervix, signficiant discharge in vault.  This portion of the physical examination was chaperoned by Delilah Shan, CMA.    Laboratory and Imaging Data: Results for orders placed or performed in visit on 01/04/15  GC/Chlamydia Probe Amp  Result Value Ref Range   CT Probe RNA NEGATIVE    GC Probe RNA NEGATIVE   Urine culture  Result Value Ref Range   Colony Count >=100,000 COLONIES/ML    Preliminary Report Gram Negative Rods   HIV antibody  Result Value Ref Range   HIV 1&2 Ab, 4th Generation NONREACTIVE NONREACTIVE  RPR  Result Value Ref Range   RPR Ser Ql NON REAC NON REAC  POCT Urinalysis Dipstick  Result Value Ref Range   Color, UA Yellow    Clarity, UA Cloudy    Glucose, UA Neg    Bilirubin, UA Neg    Ketones, UA Neg    Spec Grav, UA >=1.030    Blood, UA Neg    pH, UA 6.0    Protein, UA Neg    Urobilinogen, UA 0.2    Nitrite, UA Positive    Leukocytes, UA large (3+)      Assessment and Plan:   Bacterial vaginosis  Dysuria - Plan: POCT Urinalysis Dipstick, Urine culture  Vaginal discharge - Plan: GC/Chlamydia Probe Amp, POCT Wet Prep (Wet [place])  STD exposure - Plan: GC/Chlamydia Probe Amp, HIV antibody, RPR  Yeast infection involving the vagina and surrounding area  Acute cystitis without hematuria  Wet prep: abundant clue cells, scant yeast.  +  UTI  Treat all STD check  Follow-up: No Follow-up on file.  New Prescriptions   FLUCONAZOLE (DIFLUCAN) 150 MG TABLET    Take 1 tablet (150 mg total) by mouth once.   METRONIDAZOLE (FLAGYL) 500 MG TABLET    Take 1 tablet (500 mg total) by mouth  2 (two) times daily.   NITROFURANTOIN, MACROCRYSTAL-MONOHYDRATE, (MACROBID) 100 MG CAPSULE    Take 1 capsule (100 mg total) by mouth 2 (two) times daily.   Orders Placed This Encounter  Procedures  . GC/Chlamydia Probe Amp  . Urine culture  . HIV antibody  . RPR  . POCT Urinalysis Dipstick  . POCT Wet Prep (Wet [place])    Signed,  [place] T. Huel Centola, MD   Patient's Medications  New Prescriptions   FLUCONAZOLE (DIFLUCAN) 150 MG TABLET    Take 1 tablet (150 mg total) by mouth once.   METRONIDAZOLE (FLAGYL) 500 MG TABLET    Take 1 tablet (500 mg total) by mouth 2 (two) times daily.   NITROFURANTOIN, MACROCRYSTAL-MONOHYDRATE, (MACROBID) 100 MG CAPSULE    Take 1 capsule (100 mg total) by mouth 2 (two) times daily.  Previous Medications   ACCU-CHEK SOFTCLIX LANCETS LANCETS    Use every other day   ASPIRIN 81 MG TABLET    Take 81 mg by mouth daily.   CHOLECALCIFEROL (VITAMIN D) 2000 UNITS CAPS    Take 1 capsule (2,000 Units total) by mouth daily.   CITALOPRAM (CELEXA) 40 MG TABLET    Take 1 tablet (40 mg total) by mouth daily.   CYCLOBENZAPRINE (FLEXERIL) 5 MG TABLET    Take 1 tablet (5 mg total) by mouth at bedtime.   GABAPENTIN (NEURONTIN) 100 MG CAPSULE    Take 1 capsule (100 mg total) by mouth 3 (three) times daily.   GLUCOSE BLOOD TEST STRIP    Use every other day - Accu-Chek   LISINOPRIL (PRINIVIL,ZESTRIL) 40 MG TABLET    Take 1 tablet (40 mg total) by mouth daily.  Modified Medications   No medications on file  Discontinued Medications   METRONIDAZOLE (FLAGYL) 500 MG TABLET    Take 1 tablet (500 mg total) by mouth 2 (two) times daily.   NITROFURANTOIN, MACROCRYSTAL-MONOHYDRATE, (MACROBID) 100 MG CAPSULE    Take 1 capsule (100 mg  total) by mouth 2 (two) times daily.

## 2015-01-04 NOTE — Progress Notes (Signed)
Pre visit review using our clinic review tool, if applicable. No additional management support is needed unless otherwise documented below in the visit note. 

## 2015-01-05 LAB — GC/CHLAMYDIA PROBE AMP
CT PROBE, AMP APTIMA: NEGATIVE
GC Probe RNA: NEGATIVE

## 2015-01-05 LAB — RPR

## 2015-01-05 LAB — HIV ANTIBODY (ROUTINE TESTING W REFLEX): HIV 1&2 Ab, 4th Generation: NONREACTIVE

## 2015-01-07 ENCOUNTER — Encounter: Payer: Self-pay | Admitting: Family Medicine

## 2015-01-07 LAB — URINE CULTURE: Colony Count: >=100000

## 2015-01-07 MED ORDER — ALBUTEROL SULFATE HFA 108 (90 BASE) MCG/ACT IN AERS
2.0000 | INHALATION_SPRAY | Freq: Four times a day (QID) | RESPIRATORY_TRACT | Status: AC | PRN
Start: 2015-01-07 — End: ?

## 2015-01-07 MED ORDER — FLUTICASONE-SALMETEROL 100-50 MCG/DOSE IN AEPB: 1.0000 | INHALATION_SPRAY | Freq: Two times a day (BID) | RESPIRATORY_TRACT | Status: DC

## 2015-02-19 ENCOUNTER — Encounter: Payer: Self-pay | Admitting: Family Medicine

## 2015-02-21 ENCOUNTER — Encounter: Payer: Self-pay | Admitting: Family Medicine

## 2015-02-22 ENCOUNTER — Other Ambulatory Visit: Payer: Self-pay | Admitting: Family Medicine

## 2015-02-22 DIAGNOSIS — F3170 Bipolar disorder, currently in remission, most recent episode unspecified: Secondary | ICD-10-CM

## 2015-03-07 ENCOUNTER — Ambulatory Visit (INDEPENDENT_AMBULATORY_CARE_PROVIDER_SITE_OTHER)
Admission: RE | Admit: 2015-03-07 | Discharge: 2015-03-07 | Disposition: A | Discharge status: Home / Self Care | Payer: Medicare Other | Source: Ambulatory Visit | Attending: Internal Medicine | Admitting: Internal Medicine

## 2015-03-07 ENCOUNTER — Encounter: Payer: Self-pay | Admitting: Internal Medicine

## 2015-03-07 ENCOUNTER — Ambulatory Visit (INDEPENDENT_AMBULATORY_CARE_PROVIDER_SITE_OTHER): Payer: Medicare Other | Admitting: Internal Medicine

## 2015-03-07 VITALS — BP 122/82 | HR 81 | Temp 98.7°F | Wt 168.0 lb

## 2015-03-07 DIAGNOSIS — N390 Urinary tract infection, site not specified: Secondary | ICD-10-CM | POA: Diagnosis not present

## 2015-03-07 DIAGNOSIS — M5412 Radiculopathy, cervical region: Secondary | ICD-10-CM

## 2015-03-07 DIAGNOSIS — R109 Unspecified abdominal pain: Secondary | ICD-10-CM

## 2015-03-07 DIAGNOSIS — M47812 Spondylosis without myelopathy or radiculopathy, cervical region: Secondary | ICD-10-CM | POA: Diagnosis not present

## 2015-03-07 LAB — POCT URINALYSIS DIPSTICK
Bilirubin, UA: NEGATIVE
Glucose, UA: NEGATIVE
KETONES UA: NEGATIVE
Nitrite, UA: NEGATIVE
PH UA: 6
Spec Grav, UA: 1.025
Urobilinogen, UA: NEGATIVE

## 2015-03-07 MED ORDER — NITROFURANTOIN MONOHYD MACRO 100 MG PO CAPS: 100.0000 mg | ORAL_CAPSULE | Freq: Two times a day (BID) | ORAL | Status: DC

## 2015-03-07 NOTE — Progress Notes (Signed)
Subjective:    Patient ID: Allison Schmitt, female    DOB: May 29, 1971, 44 y.o.   MRN: [id number]  HPI  Pt presents to the clinic today with c/o a headache. This started 4 days ago. The pain is located in the back of her head and radiates down into her neck. She describes the pain as pressure. She has had some associated numbness in the right side of her neck and down her right arm. She denies any injury to the area but has had a history of a pinched nerve in the past about 15 years ago. She had xray and MRI of the cervical spine about 15 years ago. She has tried Gabapentin, Naproxyn and Flexeril with some relief.  She also wants to have her urine checked. She feels like she may have a UTI. Often times she has no symptoms other than a fever. She reports 2 days ago she felt like she had a fever, chills and body aches. She denies urgency, frequency, dysuria or low back pain. She has not tried anything OTC. She denies vaginal complaints.  Review of Systems      Past Medical History  Diagnosis Date  . Arthritis     bilateral knees  . Sciatica     per patient  . History of chicken pox   . Bipolar disorder   . Type 2 diabetes mellitus 2006    Diet controlled currently, prior on metformin  . Headache(784.0)   . HTN (hypertension)   . HLD (hyperlipidemia)   . History of kidney stones 2007    s/p lithotripsy, 2 currently, stable, has seen uro in past  . Migraines     prior on maxalt  . History of rheumatic fever     with residual murmur  . Vitamin D deficiency   . OSA (obstructive sleep apnea)     hx this, was on CPAP  . Asthma     Current Outpatient Prescriptions  Medication Sig Dispense Refill  . ACCU-CHEK SOFTCLIX LANCETS lancets Use every other day 100 each prn  . albuterol (PROAIR HFA) 108 (90 BASE) MCG/ACT inhaler Inhale 2 puffs into the lungs every 6 (six) hours as needed. 18 g 3  . aspirin 81 MG tablet Take 81 mg by mouth daily.    . Cholecalciferol (VITAMIN D) 2000 UNITS  CAPS Take 1 capsule (2,000 Units total) by mouth daily.    . citalopram (CELEXA) 40 MG tablet Take 1 tablet (40 mg total) by mouth daily. 90 tablet 3  . cyclobenzaprine (FLEXERIL) 5 MG tablet Take 1 tablet (5 mg total) by mouth at bedtime. 30 tablet 0  . Fluticasone-Salmeterol (ADVAIR) 100-50 MCG/DOSE AEPB Inhale 1 puff into the lungs every 12 (twelve) hours. 60 each 1  . gabapentin (NEURONTIN) 100 MG capsule Take 1 capsule (100 mg total) by mouth 3 (three) times daily. 90 capsule 1  . glucose blood test strip Use every other day - Accu-Chek 50 each prn  . lisinopril (PRINIVIL,ZESTRIL) 40 MG tablet Take 1 tablet (40 mg total) by mouth daily. 90 tablet 3   No current facility-administered medications for this visit.    Allergies  Allergen Reactions  . Ciprofloxacin Anaphylaxis  . Penicillins Anaphylaxis  . Sulfa Antibiotics Anaphylaxis    Thinks has taken bactrim in past...  . Prednisone Rash    Family History  Problem Relation Age of Onset  . Bipolar disorder Mother   . Diabetes Mother   . Hyperlipidemia Mother   .  Hypertension Mother   . Glaucoma Mother   . Stroke Mother   . Coronary artery disease Mother   . Glaucoma Father   . Thyroid disease Father   . Cancer Paternal Aunt     pancreatic  . Cancer Paternal Grandfather     bone  . Rheum arthritis Mother   . Aneurysm Sister 33    brain, deceased    Social History   Social History  . Marital Status: Single    Spouse Name: N/A  . Number of Children: N/A  . Years of Education: N/A   Occupational History  . Not on file.   Social History Main Topics  . Smoking status: Current Some Day Smoker    Types: Cigars  . Smokeless tobacco: Never Used     Comment: 1 cigar 3-4 x/week  . Alcohol Use: 0.0 oz/week    0 Standard drinks or equivalent per week     Comment: occasional  . Drug Use: No  . Sexual Activity:    Partners: Male    Birth Control/ Protection: Surgical     Comment: uterine ablation/tn   Other Topics  Concern  . Not on file   Social History Narrative   Caffeine: dark sodas occasionally   Lives with daughter, 1 dog   Occupation: disability from bipolar, looking for part time job   Edu: HS   Activity: walking - not recently, some cardio at home   Diet: good water, occasional fruits/vegetables     Constitutional: Pt reports headache. Denies fever, malaise, fatigue, or abrupt weight changes.  HEENT: Denies eye pain, eye redness, ear pain, ringing in the ears, wax buildup, runny nose, nasal congestion, bloody nose, or sore throat. Respiratory: Denies difficulty breathing, shortness of breath, cough or sputum production.   Cardiovascular: Denies chest pain, chest tightness, palpitations or swelling in the hands or feet.  Gastrointestinal: Denies abdominal pain, bloating, constipation, diarrhea or blood in the stool.  GU: Denies urgency, frequency, pain with urination, burning sensation, blood in urine, odor or discharge.  No other specific complaints in a complete review of systems (except as listed in HPI above).  Objective:   Physical Exam  BP 122/82 mmHg  Pulse 81  Temp(Src) 98.7 F (37.1 C) (Oral)  Wt 168 lb (76.204 kg)  SpO2 98% Wt Readings from Last 3 Encounters:  03/07/15 168 lb (76.204 kg)  01/04/15 171 lb 12 oz (77.905 kg)  11/23/14 172 lb 4 oz (78.132 kg)    General: Appears her stated age, well developed, well nourished in NAD. HEENT: Head: normal shape and size; Eyes: sclera white, no icterus, conjunctiva pink, PERRLA and EOMs intact;  Cardiovascular: Normal rate and rhythm. S1,S2 noted.   Pulmonary/Chest: Normal effort and positive vesicular breath sounds. No respiratory distress. No wheezes, rales or ronchi noted.  Abdomen: Soft and nontender. Normal bowel sounds, no bruits noted. No distention or masses noted. No CVA tenderness. Musculoskeletal: Normal flexion, extension and rotation of the cervical spine. No pain with palpation over the cervical spine. No pain  with palpation of the paracervical muscles. Grip strength equal. Neurological: Alert and oriented. Sensation intact to BUE.   BMET    Component Value Date/Time   NA 134* 10/25/2014 0854   K 4.2 10/25/2014 0854   CL 102 10/25/2014 0854   CO2 26 10/25/2014 0854   GLUCOSE 154* 10/25/2014 0854   BUN 13 10/25/2014 0854   CREATININE 0.82 10/25/2014 0854   CALCIUM 9.7 10/25/2014 0854    Lipid  Panel     Component Value Date/Time   CHOL 215* 11/01/2014 1410   CHOL 197 06/08/2011   TRIG 226.0* 11/01/2014 1410   TRIG 131 06/08/2011   HDL 39.90 11/01/2014 1410   CHOLHDL 5 11/01/2014 1410   VLDL 45.2* 11/01/2014 1410   LDLCALC 129* 03/14/2013 1205    CBC    Component Value Date/Time   WBC 5.2 10/25/2014 0854   RBC 4.55 10/25/2014 0854   HGB 13.4 10/25/2014 0854   HCT 40.3 10/25/2014 0854   PLT 281.0 10/25/2014 0854   MCV 88.5 10/25/2014 0854   MCHC 33.3 10/25/2014 0854   RDW 14.4 10/25/2014 0854   LYMPHSABS 2.3 10/25/2014 0854   MONOABS 0.3 10/25/2014 0854   EOSABS 0.1 10/25/2014 0854   BASOSABS 0.0 10/25/2014 0854    Hgb A1C Lab Results  Component Value Date   HGBA1C 6.7* 10/25/2014         Assessment & Plan:   Headache:  Could be a pinched nerve Will check xray of cervical spine today May need MRI Continue Gabapentin, Flexeril and Naproxyn at this time  UTI:  Urinalysis: 3+ leuks, trace blood Will send urine culture eRx for Macrobid 100 mg BID x 5 days Push fluids  RTC as needed or if symptoms persist or worsen

## 2015-03-07 NOTE — Progress Notes (Signed)
Pre visit review using our clinic review tool, if applicable. No additional management support is needed unless otherwise documented below in the visit note. 

## 2015-03-07 NOTE — Patient Instructions (Signed)

## 2015-03-10 LAB — URINE CULTURE: Colony Count: >=100000

## 2015-03-12 ENCOUNTER — Encounter: Payer: Self-pay | Admitting: Internal Medicine

## 2015-03-12 ENCOUNTER — Ambulatory Visit (INDEPENDENT_AMBULATORY_CARE_PROVIDER_SITE_OTHER): Payer: Medicare Other | Admitting: Internal Medicine

## 2015-03-12 VITALS — BP 114/58 | HR 90 | Temp 98.2°F | Wt 171.0 lb

## 2015-03-12 DIAGNOSIS — H6123 Impacted cerumen, bilateral: Secondary | ICD-10-CM

## 2015-03-12 DIAGNOSIS — A499 Bacterial infection, unspecified: Secondary | ICD-10-CM

## 2015-03-12 DIAGNOSIS — N760 Acute vaginitis: Secondary | ICD-10-CM | POA: Diagnosis not present

## 2015-03-12 DIAGNOSIS — B9689 Other specified bacterial agents as the cause of diseases classified elsewhere: Secondary | ICD-10-CM

## 2015-03-12 MED ORDER — METRONIDAZOLE 0.75 % VA GEL: 1.0000 | Freq: Two times a day (BID) | VAGINAL | Status: DC

## 2015-03-12 NOTE — Progress Notes (Signed)
Pre visit review using our clinic review tool, if applicable. No additional management support is needed unless otherwise documented below in the visit note. 

## 2015-03-12 NOTE — Patient Instructions (Signed)
Cerumen Impaction °A cerumen impaction is when the wax in your ear forms a plug. This plug usually causes reduced hearing. Sometimes it also causes an earache or dizziness. Removing a cerumen impaction can be difficult and painful. The wax sticks to the ear canal. The canal is sensitive and bleeds easily. If you try to remove a heavy wax buildup with a cotton tipped swab, you may push it in further. °Irrigation with water, suction, and small ear curettes may be used to clear out the wax. If the impaction is fixed to the skin in the ear canal, ear drops may be needed for a few days to loosen the wax. People who build up a lot of wax frequently can use ear wax removal products available in your local drugstore. °SEEK MEDICAL CARE IF:  °You develop an earache, increased hearing loss, or marked dizziness. °Document Released: 08/20/2004 Document Revised: 10/05/2011 Document Reviewed: 10/10/2009 °[employer]® Patient Information ©2015 [employer]. This information is not intended to replace advice given to you by your health care provider. Make sure you discuss any questions you have with your health care provider. ° °

## 2015-03-12 NOTE — Progress Notes (Signed)
Subjective:    Patient ID: Allison Schmitt, female    DOB: Aug 26, 1970, 44 y.o.   MRN: [id number]  HPI  Pt presents to the clinic today with c/o left ear pain. This started 2 days ago. She denies decreased hearing out of that ear. She denies runny nose, nasal congestion or sore throat. She has tried flushing her ear out with water and hydrogen peroxide with some relief.  She also feels like she has BV again. This is a recurrent issue for her. Her last episode was 01/04/15. She has a thin, white discharge, no itching but a fishy odor. She denies pelvic pain or abnormal bleeding.  Review of Systems      Past Medical History  Diagnosis Date  . Arthritis     bilateral knees  . Sciatica     per patient  . History of chicken pox   . Bipolar disorder   . Type 2 diabetes mellitus 2006    Diet controlled currently, prior on metformin  . Headache(784.0)   . HTN (hypertension)   . HLD (hyperlipidemia)   . History of kidney stones 2007    s/p lithotripsy, 2 currently, stable, has seen uro in past  . Migraines     prior on maxalt  . History of rheumatic fever     with residual murmur  . Vitamin D deficiency   . OSA (obstructive sleep apnea)     hx this, was on CPAP  . Asthma     Current Outpatient Prescriptions  Medication Sig Dispense Refill  . ACCU-CHEK SOFTCLIX LANCETS lancets Use every other day 100 each prn  . albuterol (PROAIR HFA) 108 (90 BASE) MCG/ACT inhaler Inhale 2 puffs into the lungs every 6 (six) hours as needed. 18 g 3  . aspirin 81 MG tablet Take 81 mg by mouth daily.    . Cholecalciferol (VITAMIN D) 2000 UNITS CAPS Take 1 capsule (2,000 Units total) by mouth daily.    . citalopram (CELEXA) 40 MG tablet Take 1 tablet (40 mg total) by mouth daily. 90 tablet 3  . cyclobenzaprine (FLEXERIL) 5 MG tablet Take 1 tablet (5 mg total) by mouth at bedtime. 30 tablet 0  . Fluticasone-Salmeterol (ADVAIR) 100-50 MCG/DOSE AEPB Inhale 1 puff into the lungs every 12 (twelve) hours.  60 each 1  . gabapentin (NEURONTIN) 100 MG capsule Take 1 capsule (100 mg total) by mouth 3 (three) times daily. 90 capsule 1  . glucose blood test strip Use every other day - Accu-Chek 50 each prn  . lisinopril (PRINIVIL,ZESTRIL) 40 MG tablet Take 1 tablet (40 mg total) by mouth daily. 90 tablet 3  . nitrofurantoin, macrocrystal-monohydrate, (MACROBID) 100 MG capsule Take 1 capsule (100 mg total) by mouth 2 (two) times daily. 14 capsule 0   No current facility-administered medications for this visit.    Allergies  Allergen Reactions  . Ciprofloxacin Anaphylaxis  . Penicillins Anaphylaxis  . Sulfa Antibiotics Anaphylaxis    Thinks has taken bactrim in past...  . Prednisone Rash    Family History  Problem Relation Age of Onset  . Bipolar disorder Mother   . Diabetes Mother   . Hyperlipidemia Mother   . Hypertension Mother   . Glaucoma Mother   . Stroke Mother   . Coronary artery disease Mother   . Glaucoma Father   . Thyroid disease Father   . Cancer Paternal Aunt     pancreatic  . Cancer Paternal Grandfather     bone  .  Rheum arthritis Mother   . Aneurysm Sister 80    brain, deceased    Social History   Social History  . Marital Status: Single    Spouse Name: N/A  . Number of Children: N/A  . Years of Education: N/A   Occupational History  . Not on file.   Social History Main Topics  . Smoking status: Current Some Day Smoker    Types: Cigars  . Smokeless tobacco: Never Used     Comment: 1 cigar 3-4 x/week  . Alcohol Use: 0.0 oz/week    0 Standard drinks or equivalent per week     Comment: occasional  . Drug Use: No  . Sexual Activity:    Partners: Male    Birth Control/ Protection: Surgical     Comment: uterine ablation/tn   Other Topics Concern  . Not on file   Social History Narrative   Caffeine: dark sodas occasionally   Lives with daughter, 1 dog   Occupation: disability from bipolar, looking for part time job   Edu: HS   Activity: walking -  not recently, some cardio at home   Diet: good water, occasional fruits/vegetables     Constitutional: Denies fever, malaise, fatigue, headache or abrupt weight changes.  HEENT: Pt reports left ear pain. Denies eye pain, eye redness, ringing in the ears, wax buildup, runny nose, nasal congestion, bloody nose, or sore throat. Respiratory: Denies difficulty breathing, shortness of breath, cough or sputum production.   Cardiovascular: Denies chest pain, chest tightness, palpitations or swelling in the hands or feet.  Gastrointestinal: Denies abdominal pain, bloating, constipation, diarrhea or blood in the stool.  GU: Pt reports vaginal discharge and odor. Denies urgency, frequency, pain with urination, burning sensation, blood in urine.   No other specific complaints in a complete review of systems (except as listed in HPI above).  Objective:   Physical Exam  BP 114/58 mmHg  Pulse 90  Temp(Src) 98.2 F (36.8 C) (Oral)  Wt 171 lb (77.565 kg)  SpO2 98% Wt Readings from Last 3 Encounters:  03/12/15 171 lb (77.565 kg)  03/07/15 168 lb (76.204 kg)  01/04/15 171 lb 12 oz (77.905 kg)    General: Appears her stated age, well developed, well nourished in NAD. Skin: Warm, dry and intact. No rashes, lesions or ulcerations noted. HEENT: Head: normal shape and size; Eyes: sclera white, no icterus, conjunctiva pink, PERRLA and EOMs intact; Ears: cerumen impaction bilaterally.  Cardiovascular: Normal rate and rhythm. S1,S2 noted.  No murmur, rubs or gallops noted.  Pulmonary/Chest: Normal effort and positive vesicular breath sounds. No respiratory distress. No wheezes, rales or ronchi noted.  Abdomen: Soft and nontender. Normal bowel sounds, no bruits noted.  Pelvic: Deffered    BMET    Component Value Date/Time   NA 134* 10/25/2014 0854   K 4.2 10/25/2014 0854   CL 102 10/25/2014 0854   CO2 26 10/25/2014 0854   GLUCOSE 154* 10/25/2014 0854   BUN 13 10/25/2014 0854   CREATININE 0.82  10/25/2014 0854   CALCIUM 9.7 10/25/2014 0854    Lipid Panel     Component Value Date/Time   CHOL 215* 11/01/2014 1410   CHOL 197 06/08/2011   TRIG 226.0* 11/01/2014 1410   TRIG 131 06/08/2011   HDL 39.90 11/01/2014 1410   CHOLHDL 5 11/01/2014 1410   VLDL 45.2* 11/01/2014 1410   LDLCALC 129* 03/14/2013 1205    CBC    Component Value Date/Time   WBC 5.2 10/25/2014 0854  RBC 4.55 10/25/2014 0854   HGB 13.4 10/25/2014 0854   HCT 40.3 10/25/2014 0854   PLT 281.0 10/25/2014 0854   MCV 88.5 10/25/2014 0854   MCHC 33.3 10/25/2014 0854   RDW 14.4 10/25/2014 0854   LYMPHSABS 2.3 10/25/2014 0854   MONOABS 0.3 10/25/2014 0854   EOSABS 0.1 10/25/2014 0854   BASOSABS 0.0 10/25/2014 0854    Hgb A1C Lab Results  Component Value Date   HGBA1C 6.7* 10/25/2014         Assessment & Plan:   Left ear pain secondary to wax buildup:  Cerumen removed in right ear using cerumen spoon by provider Left ear flushed by CMA Advised her to try Debrox OTC  Recurrent BV:  Metronidazole refilled today  RTC as needed or if symptoms persist or worsen

## 2015-03-15 ENCOUNTER — Other Ambulatory Visit: Payer: Self-pay | Admitting: Family Medicine

## 2015-03-27 ENCOUNTER — Ambulatory Visit (INDEPENDENT_AMBULATORY_CARE_PROVIDER_SITE_OTHER): Payer: Medicare Other | Admitting: Family Medicine

## 2015-03-27 ENCOUNTER — Ambulatory Visit (INDEPENDENT_AMBULATORY_CARE_PROVIDER_SITE_OTHER)
Admission: RE | Admit: 2015-03-27 | Discharge: 2015-03-27 | Disposition: A | Discharge status: Home / Self Care | Payer: Medicare Other | Source: Ambulatory Visit | Attending: Cardiovascular Disease | Admitting: Cardiovascular Disease

## 2015-03-27 ENCOUNTER — Telehealth: Payer: Self-pay | Admitting: Family Medicine

## 2015-03-27 ENCOUNTER — Encounter: Payer: Self-pay | Admitting: *Deleted

## 2015-03-27 ENCOUNTER — Encounter: Payer: Self-pay | Admitting: Family Medicine

## 2015-03-27 VITALS — BP 114/70 | HR 90 | Temp 99.0°F | Wt 169.0 lb

## 2015-03-27 DIAGNOSIS — R51 Headache: Secondary | ICD-10-CM | POA: Diagnosis not present

## 2015-03-27 DIAGNOSIS — G4489 Other headache syndrome: Secondary | ICD-10-CM | POA: Diagnosis not present

## 2015-03-27 DIAGNOSIS — R519 Headache, unspecified: Secondary | ICD-10-CM

## 2015-03-27 DIAGNOSIS — R202 Paresthesia of skin: Secondary | ICD-10-CM | POA: Insufficient documentation

## 2015-03-27 LAB — COMPREHENSIVE METABOLIC PANEL
ALBUMIN: 4.6 g/dL (ref 3.5–5.2)
ALT: 12 U/L (ref 0–35)
AST: 16 U/L (ref 0–37)
Alkaline Phosphatase: 36 U/L — ABNORMAL LOW (ref 39–117)
BILIRUBIN TOTAL: 0.4 mg/dL (ref 0.2–1.2)
BUN: 12 mg/dL (ref 6–23)
CALCIUM: 9.9 mg/dL (ref 8.4–10.5)
CHLORIDE: 104 meq/L (ref 96–112)
CO2: 26 mEq/L (ref 19–32)
Creatinine, Ser: 0.77 mg/dL (ref 0.40–1.20)
GFR: 105.3 mL/min (ref >60.00)
Glucose, Bld: 90 mg/dL (ref 70–99)
Potassium: 3.9 mEq/L (ref 3.5–5.1)
Sodium: 135 mEq/L (ref 135–145)
Total Protein: 7.9 g/dL (ref 6.0–8.3)

## 2015-03-27 LAB — SEDIMENTATION RATE: Sed Rate: 12 mm/hr (ref 0–22)

## 2015-03-27 NOTE — Progress Notes (Signed)
Pre visit review using our clinic review tool, if applicable. No additional management support is needed unless otherwise documented below in the visit note. 

## 2015-03-27 NOTE — Assessment & Plan Note (Signed)
R temple  With tenderness but no vision or motor deficits Having paresthesias in R face and arm  Is a smoker  Diff incl atypical migraine/ temporal arteritis/early Bell's palsy/ CVA or bleed  ESR and chem panel now Ref for stat CT scan now  If symptoms suddenly worsen inst to call 911

## 2015-03-27 NOTE — Assessment & Plan Note (Signed)
R side of face and arm -assoc with R temple pain in a migraine pt who smokes  No motor deficits Lab today (ESR and chem) CT head stat

## 2015-03-27 NOTE — Assessment & Plan Note (Signed)
Acute right temple with tenderness Also some parethesias (subjective) on R side  Nl neuro exam  ESR and chem panel today  CT of head head today

## 2015-03-27 NOTE — Telephone Encounter (Signed)
Pt has appt 03/27/15 at 2:15 with Dr Milinda Antis.

## 2015-03-27 NOTE — Telephone Encounter (Signed)
Patient Name: Allison Schmitt  DOB: 02/22/1971    Initial Comment Caller states she has soreness on the side of her head. She hasn't banged her head and stated there was no cause. It could also be some swelling    Nurse Assessment  Nurse: Scarlette Ar, RN, Heather Date/Time (Eastern Time): 03/27/2015 12:21:48 PM  Confirm and document reason for call. If symptomatic, describe symptoms. ---Caller states she has soreness on the side of her head. She hasn't banged her head and stated there was no cause. It could also be some swelling  Has the patient traveled out of the country within the last 30 days? ---Not Applicable  Does the patient require triage? ---Yes  Related visit to physician within the last 2 weeks? ---No  Does the PT have any chronic conditions? (i.e. diabetes, asthma, etc.) ---Yes  List chronic conditions. ---see MR  Did the patient indicate they were pregnant? ---No     Guidelines    Guideline Title Affirmed Question Affirmed Notes  Headache [1] MODERATE headache (e.g., interferes with normal activities) AND [2] present > 24 hours AND [3] unexplained (Exceptions: analgesics not tried, typical migraine, or headache part of viral illness)    Final Disposition User   See Physician within 24 Hours Standifer, RN, Research scientist (physical sciences)    Comments  Appt with Dr. Milinda Antis made for today at 2:15 pm   Referrals  REFERRED TO PCP OFFICE   Disagree/Comply: Comply

## 2015-03-27 NOTE — Progress Notes (Signed)
Subjective:    Patient ID: Allison Schmitt, female    DOB: [birth year], 44 y.o.   MRN: [id number]  HPI Here with head pain   A soreness in R temple area  It feels funny and a bit tingly in addition to that    Has a hx of migraine in the past  Those headaches usually start in the neck and stay on R side of the head Knows she has a pinched nerve in neck   Is a smoker - trying to quit  Smokes small cigars 2-3 per day   BP Readings from Last 3 Encounters:  03/27/15 114/70  03/12/15 114/58  03/07/15 122/82    Patient Active Problem List   Diagnosis Date Noted  . UTI (urinary tract infection) 11/23/2014  . Headache 10/10/2014  . Vaginitis and vulvovaginitis 11/09/2013  . Hair loss 06/19/2013  . Pustule of nostril 05/03/2013  . Abdominal pressure 05/03/2013  . Anxiety 04/19/2012  . Healthcare maintenance 01/22/2012  . Vitamin D deficiency   . Bacterial vaginosis 11/19/2011  . Asthma   . Bipolar disorder   . Diet-controlled diabetes mellitus   . HTN (hypertension)   . HLD (hyperlipidemia)   . Migraines    Past Medical History  Diagnosis Date  . Arthritis     bilateral knees  . Sciatica     per patient  . History of chicken pox   . Bipolar disorder   . Type 2 diabetes mellitus 2006    Diet controlled currently, prior on metformin  . Headache(784.0)   . HTN (hypertension)   . HLD (hyperlipidemia)   . History of kidney stones 2007    s/p lithotripsy, 2 currently, stable, has seen uro in past  . Migraines     prior on maxalt  . History of rheumatic fever     with residual murmur  . Vitamin D deficiency   . OSA (obstructive sleep apnea)     hx this, was on CPAP  . Asthma    Past Surgical History  Procedure Laterality Date  . Kidney stone surgery  1989    lithotripsy  . Tubal ligation  1997  . Knee surgery  2009    Right  . Uterine ablation  2010    dysmenorrhea and menorrhagia   Social History  Substance Use Topics  . Smoking status: Current Some Day  Smoker    Types: Cigars  . Smokeless tobacco: Never Used     Comment: 1 cigar 3-4 x/week  . Alcohol Use: 0.0 oz/week    0 Standard drinks or equivalent per week     Comment: occasional   Family History  Problem Relation Age of Onset  . Bipolar disorder Mother   . Diabetes Mother   . Hyperlipidemia Mother   . Hypertension Mother   . Glaucoma Mother   . Stroke Mother   . Coronary artery disease Mother   . Glaucoma Father   . Thyroid disease Father   . Cancer Paternal Aunt     pancreatic  . Cancer Paternal Grandfather     bone  . Rheum arthritis Mother   . Aneurysm Sister 62    brain, deceased   Allergies  Allergen Reactions  . Ciprofloxacin Anaphylaxis  . Penicillins Anaphylaxis  . Sulfa Antibiotics Anaphylaxis    Thinks has taken bactrim in past...  . Prednisone Rash   Current Outpatient Prescriptions on File Prior to Visit  Medication Sig Dispense Refill  . ACCU-CHEK SOFTCLIX LANCETS  lancets Use every other day 100 each prn  . albuterol (PROAIR HFA) 108 (90 BASE) MCG/ACT inhaler Inhale 2 puffs into the lungs every 6 (six) hours as needed. 18 g 3  . aspirin 81 MG tablet Take 81 mg by mouth daily.    . Cholecalciferol (VITAMIN D) 2000 UNITS CAPS Take 1 capsule (2,000 Units total) by mouth daily.    . citalopram (CELEXA) 40 MG tablet TAKE ONE TABLET BY MOUTH ONE TIME DAILY 90 tablet 2  . cyclobenzaprine (FLEXERIL) 5 MG tablet Take 1 tablet (5 mg total) by mouth at bedtime. 30 tablet 0  . Fluticasone-Salmeterol (ADVAIR) 100-50 MCG/DOSE AEPB Inhale 1 puff into the lungs every 12 (twelve) hours. 60 each 1  . gabapentin (NEURONTIN) 100 MG capsule Take 1 capsule (100 mg total) by mouth 3 (three) times daily. 90 capsule 1  . glucose blood test strip Use every other day - Accu-Chek 50 each prn  . lisinopril (PRINIVIL,ZESTRIL) 40 MG tablet TAKE ONE TABLET BY MOUTH ONE TIME DAILY 90 tablet 2  . metroNIDAZOLE (METROGEL) 0.75 % vaginal gel Place 1 Applicatorful vaginally 2 (two)  times daily. 70 g 0   No current facility-administered medications on file prior to visit.     Review of Systems Review of Systems  Constitutional: Negative for fever, appetite change, fatigue and unexpected weight change.  ENT pos for mild nasal congestion w/o sinus pain  Eyes: Negative for pain and visual disturbance.  Respiratory: Negative for cough and shortness of breath.   Cardiovascular: Negative for cp or palpitations    Gastrointestinal: Negative for nausea, diarrhea and constipation.  Genitourinary: Negative for urgency and frequency.  Skin: Negative for pallor or rash   Neurological: Negative for weakness, light-headedness,  and pos for  headaches. neg for speech slurring  Hematological: Negative for adenopathy. Does not bruise/bleed easily.  Psychiatric/Behavioral: Negative for dysphoric mood. The patient is somewhat nervous/anxious.         Objective:   Physical Exam  Constitutional: She is oriented to person, place, and time. She appears well-developed and well-nourished. No distress.  HENT:  Head: Normocephalic and atraumatic.  Right Ear: External ear normal.  Left Ear: External ear normal.  Nose: Nose normal.  Mouth/Throat: Oropharynx is clear and moist. No oropharyngeal exudate.  No sinus tenderness R  temporal tenderness noted (no skin change or swelling) No TMJ tenderness  Eyes: Conjunctivae and EOM are normal. Pupils are equal, round, and reactive to light. Right eye exhibits no discharge. Left eye exhibits no discharge. No scleral icterus.  No nystagmus  Neck: Normal range of motion and full passive range of motion without pain. Neck supple. No JVD present. Carotid bruit is not present. No tracheal deviation present. No thyromegaly present.  Cardiovascular: Normal rate, regular rhythm and normal heart sounds.   No murmur heard. Pulmonary/Chest: Effort normal and breath sounds normal. No respiratory distress. She has no wheezes. She has no rales.  Abdominal:  Soft. Bowel sounds are normal. She exhibits no distension and no mass. There is no tenderness.  Musculoskeletal: She exhibits no edema or tenderness.  Lymphadenopathy:    She has no cervical adenopathy.  Neurological: She is alert and oriented to person, place, and time. She has normal strength and normal reflexes. She displays no atrophy and no tremor. No cranial nerve deficit or sensory deficit. She exhibits normal muscle tone. She displays a negative Romberg sign. Coordination and gait normal.  No focal cerebellar signs   Nl gait including tandem  Skin: Skin is warm and dry. No rash noted. No pallor.  Psychiatric: Her behavior is normal. Thought content normal. Her mood appears anxious.  Mildly anxious about symptoms  Otherwise pleasant and talkative           Assessment & Plan:   Problem List Items Addressed This Visit    Headache - Primary    Acute right temple with tenderness Also some parethesias (subjective) on R side  Nl neuro exam  ESR and chem panel today  CT of head head today         Relevant Orders   CT Head Wo Contrast   Comprehensive metabolic panel   Comprehensive metabolic panel   Sedimentation Rate   Pain in head    R temple  With tenderness but no vision or motor deficits Having paresthesias in R face and arm  Is a smoker  Diff incl atypical migraine/ temporal arteritis/early Bell's palsy/ CVA or bleed  ESR and chem panel now Ref for stat CT scan now  If symptoms suddenly worsen inst to call 911      Relevant Orders   CT Head Wo Contrast   Comprehensive metabolic panel   Comprehensive metabolic panel   Sedimentation Rate   Paresthesia    R side of face and arm -assoc with R temple pain in a migraine pt who smokes  No motor deficits Lab today (ESR and chem) CT head stat       Relevant Orders   CT Head Wo Contrast   Comprehensive metabolic panel

## 2015-03-27 NOTE — Patient Instructions (Signed)
Neuro exam today is reassuring  Lab today (sed rate to rule out temporal arteritis) and also a chemistry panel  Stop at check out for referral for head CT scan If symptoms suddenly worsen - go to ER or call 911 - especially if speech becomes slurred or headache becomes severe  Gabapentin is ok  I suspect this may be an atypical migraine but we need to rule out other conditions   Please quit smoking asap!

## 2015-03-28 ENCOUNTER — Telehealth (INDEPENDENT_AMBULATORY_CARE_PROVIDER_SITE_OTHER): Payer: Medicare Other | Admitting: *Deleted

## 2015-03-28 DIAGNOSIS — R109 Unspecified abdominal pain: Secondary | ICD-10-CM | POA: Diagnosis not present

## 2015-03-28 DIAGNOSIS — R8290 Unspecified abnormal findings in urine: Secondary | ICD-10-CM

## 2015-03-28 LAB — POCT URINALYSIS DIPSTICK
BILIRUBIN UA: NEGATIVE
Glucose, UA: NEGATIVE
KETONES UA: NEGATIVE
Nitrite, UA: NEGATIVE
PH UA: 6
Spec Grav, UA: >=1.03
Urobilinogen, UA: 0.2

## 2015-03-28 NOTE — Telephone Encounter (Signed)
Pt dropped urine. Results in chart. Sent for microscope and UCx.

## 2015-03-29 ENCOUNTER — Other Ambulatory Visit: Payer: Self-pay

## 2015-03-29 LAB — URINALYSIS, MICROSCOPIC ONLY
Casts: NONE SEEN [LPF]
Yeast: NONE SEEN [HPF]

## 2015-03-31 LAB — URINE CULTURE

## 2015-04-02 ENCOUNTER — Ambulatory Visit: Payer: 59 | Admitting: Psychiatry

## 2015-04-03 ENCOUNTER — Other Ambulatory Visit: Payer: Self-pay | Admitting: Family Medicine

## 2015-04-03 DIAGNOSIS — N390 Urinary tract infection, site not specified: Secondary | ICD-10-CM

## 2015-04-03 MED ORDER — FOSFOMYCIN TROMETHAMINE 3 G PO PACK: 3.0000 g | PACK | Freq: Once | ORAL | Status: DC

## 2015-04-05 ENCOUNTER — Ambulatory Visit: Payer: Self-pay | Admitting: Family Medicine

## 2015-04-05 ENCOUNTER — Encounter: Payer: Self-pay | Admitting: Family Medicine

## 2015-04-05 ENCOUNTER — Ambulatory Visit (INDEPENDENT_AMBULATORY_CARE_PROVIDER_SITE_OTHER): Payer: Medicare Other | Admitting: Family Medicine

## 2015-04-05 VITALS — BP 124/82 | HR 72 | Temp 98.0°F | Wt 170.2 lb

## 2015-04-05 DIAGNOSIS — Z881 Allergy status to other antibiotic agents status: Secondary | ICD-10-CM

## 2015-04-05 DIAGNOSIS — Q048 Other specified congenital malformations of brain: Secondary | ICD-10-CM

## 2015-04-05 DIAGNOSIS — M5412 Radiculopathy, cervical region: Secondary | ICD-10-CM | POA: Diagnosis not present

## 2015-04-05 DIAGNOSIS — N390 Urinary tract infection, site not specified: Secondary | ICD-10-CM | POA: Diagnosis not present

## 2015-04-05 MED ORDER — METHYLPREDNISOLONE 4 MG PO TBPK: ORAL_TABLET | ORAL | Status: DC

## 2015-04-05 MED ORDER — GLUCOSE BLOOD VI STRP: ORAL_STRIP | Status: AC

## 2015-04-05 MED ORDER — GABAPENTIN 100 MG PO CAPS: 200.0000 mg | ORAL_CAPSULE | Freq: Two times a day (BID) | ORAL | Status: DC

## 2015-04-05 NOTE — Assessment & Plan Note (Signed)
4th citrobacter koseri infection in last 6 months - last 3 treated with macrobid without resolution. Will try fosfomycin (PA in process) and pt will return in 2 wks for TOC. Discussed possible referral to urology for recurrent UTI.

## 2015-04-05 NOTE — Progress Notes (Signed)
Pre visit review using our clinic review tool, if applicable. No additional management support is needed unless otherwise documented below in the visit note. 

## 2015-04-05 NOTE — Assessment & Plan Note (Signed)
Documented anaphylactic allergy to sulfa, cipro, PCN as well as less severe reaction to prednisone (?hives/rash). Pt unsure if truly allergic to any of these antibiotics. If truly allergic, severely limited in antibiotic choices. Agrees to allergist referral to help further elucidate this question.

## 2015-04-05 NOTE — Assessment & Plan Note (Signed)
Unclear significance of this finding - will continue to monitor.

## 2015-04-05 NOTE — Assessment & Plan Note (Signed)
Chronic, ongoing issue. Strength intact but diminished sensation. Pain not controlled on gabapentin  BID - will increase to  BID with option for  BID over next several weeks. Will also treat with medrol dose pack (in ?prednisone allergy). Discussed need to monitor sugars on steroid as well as watch for and let us know immediately if any rash develops. Pt agrees to try methylprednisolone despite prednisone allergy listed in chart (non-life threatening reaction). If no improvement with this, consider updated MRI (last ~2007-2008, no records available).

## 2015-04-05 NOTE — Patient Instructions (Addendum)
We will refer you to allergy doctor to further evaluate all your allergies and see if they truly are allergies. Take fosfomycin antibiotic for urine then return in 2 weeks to re-culture urine (test of cure) Take medrol dosepack for neck. Continue gabapentin. If no better with steroid we would consider MRI of neck. While you're on steroid (medrol) don't take anti inflammatories. Call me with an update on effect.
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
## 2015-04-08 ENCOUNTER — Telehealth: Payer: Self-pay | Admitting: *Deleted

## 2015-04-08 NOTE — Telephone Encounter (Signed)
PA for fosfomycin denied. In your IN box for review. Message left notifying patient to call and advise me if she paid for med out of pocket or if she still hasn't taken any med.

## 2015-04-10 ENCOUNTER — Encounter: Payer: Self-pay | Admitting: Family Medicine

## 2015-04-10 DIAGNOSIS — G4733 Obstructive sleep apnea (adult) (pediatric): Secondary | ICD-10-CM | POA: Insufficient documentation

## 2015-04-10 DIAGNOSIS — N390 Urinary tract infection, site not specified: Secondary | ICD-10-CM

## 2015-04-10 NOTE — Telephone Encounter (Signed)
Message left notifying patient and also sent message via Mychart.

## 2015-04-10 NOTE — Telephone Encounter (Signed)
pulm referral placed. Would like her to come in 1-2 wks for urine culture for test of cure. Future order placed.

## 2015-04-10 NOTE — Telephone Encounter (Signed)
See my chart message

## 2015-04-22 ENCOUNTER — Other Ambulatory Visit: Payer: Medicare Other | Admitting: Family Medicine

## 2015-04-22 DIAGNOSIS — N390 Urinary tract infection, site not specified: Secondary | ICD-10-CM | POA: Diagnosis not present

## 2015-04-25 LAB — URINE CULTURE

## 2015-05-06 ENCOUNTER — Encounter: Payer: Self-pay | Admitting: Family Medicine

## 2015-05-07 ENCOUNTER — Encounter: Payer: Self-pay | Admitting: Family Medicine

## 2015-05-07 DIAGNOSIS — N760 Acute vaginitis: Principal | ICD-10-CM

## 2015-05-07 DIAGNOSIS — B9689 Other specified bacterial agents as the cause of diseases classified elsewhere: Secondary | ICD-10-CM

## 2015-05-08 ENCOUNTER — Telehealth: Payer: Self-pay

## 2015-05-08 ENCOUNTER — Telehealth: Payer: Self-pay | Admitting: Family Medicine

## 2015-05-08 DIAGNOSIS — B9689 Other specified bacterial agents as the cause of diseases classified elsewhere: Secondary | ICD-10-CM

## 2015-05-08 DIAGNOSIS — N760 Acute vaginitis: Principal | ICD-10-CM

## 2015-05-08 MED ORDER — METRONIDAZOLE 0.75 % VA GEL: 1.0000 | Freq: Two times a day (BID) | VAGINAL | Status: DC

## 2015-05-08 NOTE — Telephone Encounter (Signed)
metrogel sent to pharmacy. Pt notified.

## 2015-05-08 NOTE — Telephone Encounter (Signed)
Noted, thanks!

## 2015-05-08 NOTE — Telephone Encounter (Signed)
yes

## 2015-05-08 NOTE — Telephone Encounter (Signed)
Ok to do BID dosing for patient.

## 2015-05-08 NOTE — Telephone Encounter (Signed)
G- did you address this yet?  I haven't seen her recently.  Let me know.  Thanks.

## 2015-05-08 NOTE — Telephone Encounter (Signed)
Allison Schmitt with [facility] left v/m requesting cb for clarification of instructions for metrogel vaginal gel; instructions are 1 applicator vaginally bid; Neysa BonitoChristy said typically instructions are to use daily; request cb to clarify.

## 2015-05-08 NOTE — Telephone Encounter (Signed)
Christy notified and # increased to 10 since it only comes in #5.

## 2015-05-09 ENCOUNTER — Encounter: Payer: Self-pay | Admitting: Psychiatry

## 2015-05-09 ENCOUNTER — Ambulatory Visit (INDEPENDENT_AMBULATORY_CARE_PROVIDER_SITE_OTHER): Payer: 59 | Admitting: Psychiatry

## 2015-05-09 ENCOUNTER — Encounter: Payer: Self-pay | Admitting: Family Medicine

## 2015-05-09 ENCOUNTER — Ambulatory Visit (INDEPENDENT_AMBULATORY_CARE_PROVIDER_SITE_OTHER): Payer: Medicare Other | Admitting: Family Medicine

## 2015-05-09 VITALS — BP 110/68 | HR 96 | Temp 98.6°F | Ht 62.0 in | Wt 167.8 lb

## 2015-05-09 VITALS — BP 102/60 | HR 88 | Temp 98.8°F | Wt 167.5 lb

## 2015-05-09 DIAGNOSIS — F3132 Bipolar disorder, current episode depressed, moderate: Secondary | ICD-10-CM

## 2015-05-09 DIAGNOSIS — Z881 Allergy status to other antibiotic agents status: Secondary | ICD-10-CM | POA: Diagnosis not present

## 2015-05-09 DIAGNOSIS — N390 Urinary tract infection, site not specified: Secondary | ICD-10-CM

## 2015-05-09 DIAGNOSIS — Q048 Other specified congenital malformations of brain: Secondary | ICD-10-CM

## 2015-05-09 DIAGNOSIS — R5383 Other fatigue: Secondary | ICD-10-CM

## 2015-05-09 DIAGNOSIS — N760 Acute vaginitis: Secondary | ICD-10-CM

## 2015-05-09 DIAGNOSIS — G4489 Other headache syndrome: Secondary | ICD-10-CM

## 2015-05-09 LAB — POCT URINALYSIS DIPSTICK
BILIRUBIN UA: NEGATIVE
GLUCOSE UA: NEGATIVE
KETONES UA: NEGATIVE
Nitrite, UA: NEGATIVE
Protein, UA: NEGATIVE
RBC UA: NEGATIVE
SPEC GRAV UA: 1.02
UROBILINOGEN UA: 0.2
pH, UA: 6

## 2015-05-09 MED ORDER — NITROFURANTOIN MACROCRYSTAL 50 MG PO CAPS: 50.0000 mg | ORAL_CAPSULE | Freq: Every day | ORAL | Status: DC

## 2015-05-09 MED ORDER — LAMOTRIGINE 25 MG PO TABS: ORAL_TABLET | ORAL | Status: DC

## 2015-05-09 MED ORDER — NITROFURANTOIN MONOHYD MACRO 100 MG PO CAPS: 100.0000 mg | ORAL_CAPSULE | Freq: Two times a day (BID) | ORAL | Status: DC

## 2015-05-09 NOTE — Progress Notes (Signed)
Pre visit review using our clinic review tool, if applicable. No additional management support is needed unless otherwise documented below in the visit note. 

## 2015-05-09 NOTE — Assessment & Plan Note (Addendum)
?  7 episodes of BV treated in the past year. Currently completing metrogel BID treatment. Advised to return to clinic if sxs don't resolve for wet prep. If sxs resolve, will recommend twice weekly metrogel suppressive dose. She was on this 2014 which may have helped.

## 2015-05-09 NOTE — Patient Instructions (Addendum)
Urine sent for culture - start macrobid for 7 days for presumed infection and we will call you with urine results. After you finish macrobid, start daily medicine to prevent urine infections. Finish metrogel and return for wet prep if this doesn't resolve vaginal discharge.  Let us know if recurrent headaches for neurologist referral.

## 2015-05-09 NOTE — Assessment & Plan Note (Signed)
With persistent headaches, discussed neurology referral. Pt prefers to monitor sxs for now and will let me know if desires this.

## 2015-05-09 NOTE — Telephone Encounter (Signed)
Seen today. 

## 2015-05-09 NOTE — Assessment & Plan Note (Signed)
Recent urine culture while asymptomatic also returned growing citrobacter koserii pointing to chronic colonization. However now she endorses symptoms of nocturia, incomplete night time emptying, and general malaise. Urinalysis today showing 2+ LE and micro with 5-10 WBC/hpf, concern for reinfection so UCx resent, start macrobid 7d course. After treatment, will start nitrocurantoin 50mg  daily preventatively. Pt agrees.

## 2015-05-09 NOTE — Progress Notes (Signed)
BP 102/60 mmHg  Pulse 88  Temp(Src) 98.8 F (37.1 C) (Oral)  Wt 167 lb 8 oz (75.978 kg)  LMP  (Within Weeks)   CC: headache and vaginal symptoms.  Subjective:    Patient ID: Allison Schmitt, female    DOB: 06-03-1971, 89 y.o.   MRN: [id number]  HPI: Allison Lemmingsrika R Joffe is a 44 y.o. female presenting on 05/09/2015 for Headache and Vaginitis   1wk h/o thin vaginal discharge with foul odor. Also endorses night time awakenings with incomplete emptying, headaceh and some dizziness/malaise. No dysuria, hematuria, abd or flank pain, nausea/vomiting. She started metrogel she had left over (Tuesday night) - but hasn't picked up new Rx for metrogel at pharmacy.   H/o recurrent BV infection - reviewed chart, 7 metronidazole treatments (oral or topical) over the last year.   Recent recurrent citrobacter koseri UTI with intermediate sensitivity to nitrofurantion - 4 UTIs growing citrobacter in past 6 months - 3 treated with macrobid, 4th treated with fosfomycin that pt had to pay out of pocket because insurance would not cover. Sxs resolved after treatment. Test of cure UCx 04/22/2015 returned again with 80k citrobacter (without sxs) pointing to colonization.   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   Multiple anaphylactic antibiotic allergies per chart (sulfa, cipro, PCN). Missed allergist appt due to lacking copay.   Relevant past medical, surgical, family and social history reviewed and updated as indicated. Interim medical history since our last visit reviewed. Allergies and medications reviewed and updated. Current Outpatient Prescriptions on File Prior to Visit  Medication Sig  . ACCU-CHEK SOFTCLIX LANCETS lancets Use every other day  . albuterol (PROAIR HFA) 108 (90 BASE) MCG/ACT inhaler Inhale 2 puffs into the lungs every 6 (six) hours as needed. (Patient not taking: Reported on 05/09/2015)  . citalopram (CELEXA) 40 MG tablet TAKE ONE TABLET BY MOUTH ONE TIME DAILY  . cyclobenzaprine (FLEXERIL) 5 MG  tablet Take 1 tablet (5 mg total) by mouth at bedtime.  . gabapentin (NEURONTIN) 100 MG capsule Take 2-3 capsules (200-300 mg total) by mouth 2 (two) times daily.  Marland Kitchen. glucose blood test strip Use every other day - Accu-Chek  . lisinopril (PRINIVIL,ZESTRIL) 40 MG tablet TAKE ONE TABLET BY MOUTH ONE TIME DAILY  . metroNIDAZOLE (METROGEL) 0.75 % vaginal gel Place 1 Applicatorful vaginally 2 (two) times daily.   No current facility-administered medications on file prior to visit.    Review of Systems Per HPI unless specifically indicated above     Objective:    BP 102/60 mmHg  Pulse 88  Temp(Src) 98.8 F (37.1 C) (Oral)  Wt 167 lb 8 oz (75.978 kg)  LMP  (Within Weeks)  Wt Readings from Last 3 Encounters:  05/09/15 167 lb 12.8 oz (76.114 kg)  05/09/15 167 lb 8 oz (75.978 kg)  04/05/15 170 lb 4 oz (77.225 kg)    Physical Exam  Constitutional: She appears well-developed and well-nourished. No distress.  HENT:  Mouth/Throat: Oropharynx is clear and moist. No oropharyngeal exudate.  Abdominal: Soft. Normal appearance and bowel sounds are normal. She exhibits no distension and no mass. There is no hepatosplenomegaly. There is tenderness (mild pressure) in the suprapubic area. There is no rigidity, no rebound, no guarding, no CVA tenderness and negative Murphy's sign.  Musculoskeletal: She exhibits no edema.  Skin: Skin is warm and dry. No rash noted.  Psychiatric: She has a normal mood and affect.  Nursing note and vitals reviewed.  Results for orders placed or performed in  visit on 05/09/15  POCT Urinalysis Dipstick  Result Value Ref Range   Color, UA Yellow    Clarity, UA Hazy    Glucose, UA Negative    Bilirubin, UA Negative    Ketones, UA Negative    Spec Grav, UA 1.020    Blood, UA Negative    pH, UA 6.0    Protein, UA Negative    Urobilinogen, UA 0.2    Nitrite, UA Negative    Leukocytes, UA moderate (2+) (A) Negative      Assessment & Plan:   Problem List Items  Addressed This Visit    Recurrent vaginitis    ?7 episodes of BV treated in the past year. Currently completing metrogel BID treatment. Advised to return to clinic if sxs don't resolve for wet prep. If sxs resolve, will recommend twice weekly metrogel suppressive dose. She was on this 2014 which may have helped.      Recurrent UTI (urinary tract infection) - Primary    Recent urine culture while asymptomatic also returned growing citrobacter koserii pointing to chronic colonization. However now she endorses symptoms of nocturia, incomplete night time emptying, and general malaise. Urinalysis today showing 2+ LE and micro with 5-10 WBC/hpf, concern for reinfection so UCx resent, start macrobid 7d course. After treatment, will start nitrocurantoin  daily preventatively. Pt agrees.      Relevant Medications   nitrofurantoin, macrocrystal-monohydrate, (MACROBID) 100 MG capsule   nitrofurantoin (MACRODANTIN) 50 MG capsule   Other Relevant Orders   Urine culture   Headache    Offered neurology referral - pt wants to see if abx treatment resolves headache.      Cerebellar tonsillar ectopia (HCC)    With persistent headaches, discussed neurology referral. Pt prefers to monitor sxs for now and will let me know if desires this.      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

## 2015-05-09 NOTE — Assessment & Plan Note (Signed)
Offered neurology referral - pt wants to see if abx treatment resolves headache.
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
## 2015-05-11 LAB — URINE CULTURE

## 2015-05-12 ENCOUNTER — Other Ambulatory Visit: Payer: Self-pay | Admitting: Family Medicine

## 2015-05-12 MED ORDER — METRONIDAZOLE 0.75 % VA GEL: 1.0000 | VAGINAL | Status: DC

## 2015-05-16 ENCOUNTER — Encounter: Payer: Self-pay | Admitting: Internal Medicine

## 2015-05-16 ENCOUNTER — Ambulatory Visit (INDEPENDENT_AMBULATORY_CARE_PROVIDER_SITE_OTHER): Payer: Medicare Other | Admitting: Internal Medicine

## 2015-05-16 VITALS — BP 104/62 | HR 74 | Ht 62.0 in | Wt 167.0 lb

## 2015-05-16 DIAGNOSIS — Z23 Encounter for immunization: Secondary | ICD-10-CM

## 2015-05-16 DIAGNOSIS — F329 Major depressive disorder, single episode, unspecified: Secondary | ICD-10-CM

## 2015-05-16 DIAGNOSIS — G4733 Obstructive sleep apnea (adult) (pediatric): Secondary | ICD-10-CM | POA: Diagnosis not present

## 2015-05-16 DIAGNOSIS — F32A Depression, unspecified: Secondary | ICD-10-CM

## 2015-05-16 NOTE — Addendum Note (Signed)
Addended by: Maxwell MarionBLANKENSHIP, Armari Fussell A on: 05/16/2015 09:35 AM   Modules accepted: Orders

## 2015-05-16 NOTE — Progress Notes (Signed)
[facility] Consultation      Assessment and Plan:  OSA.  -Now that she has not been on CPAP for several years, will need to send for a new sleep study. Will Send for home sleep study. If this is positive. The Patient for a CPAP Titration Study. Previously She Was on a CPAP of 11  Snoring.  -Loud snoring with witnessed apneas in the past, likely due to progressive sleep apnea.  Depression-Bipolar -With excessive daytime sleepiness, sleep apnea may be contributing.  Date: 05/16/2015  MRN# [id number] Allison Schmitt 1970/10/02  Referring Physician:   AMORY ZBIKOWSKI is a 44 y.o. old female seen in consultation for chief complaint of:   No chief complaint on file.   HPI:  Patient is a 44 year old female with a history of bipolar disorder, recurrent UTIs and allergies to multiple antibiotics.  She was diagnosed with short of sleep apnea, apparently in 2007, she has not used her machine since that time. At that time her ex told her that she would stop breathing in her sleep and would choke, she also notes that she snores loudly. She tried the PAP for about a month, she took it off frequently, then stopped using it. She then she has found that OSA is serious and wants to follow up.  She is not particularly sleepy, but she has depression, and frequently has to nap during the day which she attributes to depression. She continues to snores at night. She does not have cataplexy symptoms.  She goes to bed between 10 to 1. Wakes at 6 to 7 am by her dog.  Occasionally runny nose.   [facility] of [facility]:  [facility]; AHi of 11, including RERA was 27. [facility] ticket 10/12/06; CPAP @ 11. Pt is not sure if she had a titration study.   Pulmonary Functions Testing Results:  No results found for: FEV1, FVC, FEV1FVC, TLC, DLCO   PMHX:   Past Medical History  Diagnosis Date  . Arthritis     bilateral knees  . Sciatica    per patient  . History of chicken pox   . Bipolar disorder (HCC)   . Type 2 diabetes mellitus (HCC) 2006    Diet controlled currently, prior on metformin  . Headache(784.0)   . HTN (hypertension)   . HLD (hyperlipidemia)   . History of kidney stones 2007    s/p lithotripsy, 2 currently, stable, has seen uro in past  . Migraines     prior on maxalt  . History of rheumatic fever     with residual murmur  . Vitamin D deficiency   . OSA (obstructive sleep apnea)     hx this, was on CPAP  . Asthma   . Anxiety    Surgical Hx:  Past Surgical History  Procedure Laterality Date  . Kidney stone surgery  1989    lithotripsy  . Tubal ligation  1997  . Knee surgery  2009    Right  . Uterine ablation  2010    dysmenorrhea and menorrhagia   Family Hx:  Family History  Problem Relation Age of Onset  . Bipolar disorder Mother   . Diabetes Mother   . Hyperlipidemia Mother   . Hypertension Mother   . Glaucoma Mother   . Stroke Mother   . Coronary artery disease Mother   . Rheum arthritis Mother   . Anxiety disorder Mother   . Depression Mother   .  Glaucoma Father   . Thyroid disease Father   . Cancer Paternal Aunt     pancreatic  . Cancer Paternal Grandfather     bone  . Aneurysm Sister 46    brain, deceased  . Drug abuse Sister   . Bipolar disorder Sister   . Depression Sister   . Depression Sister   . Anxiety disorder Sister   . Diabetes Sister    Social Hx:   Social History  Substance Use Topics  . Smoking status: Current Every Day Smoker    Types: Cigars    Start date: 05/09/1995  . Smokeless tobacco: Never Used     Comment: 1 cigar 3-4 x/week off and on for 20 years  . Alcohol Use: 3.0 - 4.8 oz/week    0 Standard drinks or equivalent, 4-6 Glasses of wine, 1-2 Cans of beer, 0 Shots of liquor per week     Comment: occasional   Medication:   Current Outpatient Rx  Name  Route  Sig  Dispense  Refill  . ACCU-CHEK SOFTCLIX LANCETS lancets      Use every other  day   100 each   prn   . albuterol (PROAIR HFA) 108 (90 BASE) MCG/ACT inhaler   Inhalation   Inhale 2 puffs into the lungs every 6 (six) hours as needed. Patient not taking: Reported on 05/09/2015   18 g   3   . citalopram (CELEXA) 40 MG tablet      TAKE ONE TABLET BY MOUTH ONE TIME DAILY   90 tablet   2   . cyclobenzaprine (FLEXERIL) 5 MG tablet   Oral   Take 1 tablet (5 mg total) by mouth at bedtime.   30 tablet   0   . gabapentin (NEURONTIN) 100 MG capsule   Oral   Take 2-3 capsules (200-300 mg total) by mouth 2 (two) times daily.   180 capsule   1   . glucose blood test strip      Use every other day - Accu-Chek   50 each   6   . lamoTRIgine (LAMICTAL) 25 MG tablet      Take one tablet in the morning for seven days and then increase to two tablets in the morning.   60 tablet   1   . lisinopril (PRINIVIL,ZESTRIL) 40 MG tablet      TAKE ONE TABLET BY MOUTH ONE TIME DAILY   90 tablet   2   . metroNIDAZOLE (METROGEL) 0.75 % vaginal gel   Vaginal   Place 1 Applicatorful vaginally 2 (two) times daily.   70 g   0   . metroNIDAZOLE (METROGEL) 0.75 % vaginal gel   Vaginal   Place 1 Applicatorful vaginally 2 (two) times a week. For BV prevention   70 g   3   . nitrofurantoin (MACRODANTIN) 50 MG capsule   Oral   Take 1 capsule (50 mg total) by mouth daily.   30 capsule   1   . nitrofurantoin, macrocrystal-monohydrate, (MACROBID) 100 MG capsule   Oral   Take 1 capsule (100 mg total) by mouth 2 (two) times daily.   14 capsule   0       Allergies:  Ciprofloxacin; Penicillins; Sulfa antibiotics; and Prednisone  Review of Systems: Gen:  Denies  fever, sweats, chills HEENT: Denies blurred vision, double vision. bleeds, sore throat Cvc:  No dizziness, chest pain. Resp:   Denies cough or sputum porduction, shortness of breath Gi: Denies  swallowing difficulty, stomach pain. Gu:  Denies bladder incontinence, burning urine Ext:   No Joint pain,  stiffness. Skin: No skin rash,  hives Endoc:  No polyuria, polydipsia. Psych: No depression, insomnia. Other:  All other systems were reviewed with the patient and were negative other that what is mentioned in the HPI.   Physical Examination:   VS: LMP  (Within Weeks)  General Appearance: No distress  Neuro:without focal findings,  speech normal,  HEENT: PERRLA, EOM intact.  Mallampati 3 Pulmonary: normal breath sounds, No wheezing.  CardiovascularNormal S1,S2.  No m/r/g.   Abdomen: Benign, Soft, non-tender. Renal:  No costovertebral tenderness  GU:  No performed at this time. Endoc: No evident thyromegaly, no signs of acromegaly. Skin:   warm, no rashes, no ecchymosis  Extremities: normal, no cyanosis, clubbing.  Other findings:    LABORATORY PANEL:   CBC No results for input(s): WBC, HGB, HCT, PLT in the last 168 hours. ------------------------------------------------------------------------------------------------------------------  Chemistries  No results for input(s): NA, K, CL, CO2, GLUCOSE, BUN, CREATININE, CALCIUM, MG, AST, ALT, ALKPHOS, BILITOT in the last 168 hours.  Invalid input(s): GFRCGP ------------------------------------------------------------------------------------------------------------------  Cardiac Enzymes No results for input(s): TROPONINI in the last 168 hours. ------------------------------------------------------------  RADIOLOGY:  No results found.     Thank  you for the consultation and for allowing [facility] to assist in the care of your patient. Our recommendations are noted above.  Please contact us if we can be of further service.   Wells Guiles, MD.  Board Certified in Internal Medicine, Pulmonary Medicine, Critical Care Medicine, and Sleep Medicine.  [facility] Office Number: [phone]  Santiago Glad, M.D.  Stephanie Acre, M.D.  Billy Fischer, M.D

## 2015-05-16 NOTE — Patient Instructions (Signed)
Will send for a home sleep study.  If the home test is positive will send you to the lab for a CPAP titration study.

## 2015-05-21 ENCOUNTER — Encounter: Payer: Self-pay | Admitting: Primary Care

## 2015-05-21 ENCOUNTER — Ambulatory Visit (INDEPENDENT_AMBULATORY_CARE_PROVIDER_SITE_OTHER)
Admission: RE | Admit: 2015-05-21 | Discharge: 2015-05-21 | Disposition: A | Discharge status: Home / Self Care | Payer: Medicare Other | Source: Ambulatory Visit | Attending: Primary Care | Admitting: Primary Care

## 2015-05-21 ENCOUNTER — Ambulatory Visit (INDEPENDENT_AMBULATORY_CARE_PROVIDER_SITE_OTHER): Payer: Medicare Other | Admitting: Primary Care

## 2015-05-21 DIAGNOSIS — R109 Unspecified abdominal pain: Secondary | ICD-10-CM

## 2015-05-21 DIAGNOSIS — N200 Calculus of kidney: Secondary | ICD-10-CM | POA: Diagnosis not present

## 2015-05-21 LAB — POCT URINALYSIS DIPSTICK
Bilirubin, UA: NEGATIVE
GLUCOSE UA: NEGATIVE
KETONES UA: NEGATIVE
Nitrite, UA: NEGATIVE
PH UA: 6.5
Spec Grav, UA: 1.015
Urobilinogen, UA: NEGATIVE

## 2015-05-21 MED ORDER — IOHEXOL 300 MG/ML  SOLN
100.0000 mL | Freq: Once | INTRAMUSCULAR | Status: AC | PRN
  Administered 2015-05-21: 100 mL via INTRAVENOUS

## 2015-05-21 MED ORDER — CEPHALEXIN 500 MG PO CAPS: 500.0000 mg | ORAL_CAPSULE | Freq: Two times a day (BID) | ORAL | Status: DC

## 2015-05-21 NOTE — Progress Notes (Signed)
Subjective:    Patient ID: Allison Schmitt, female    DOB: Jul 12, 1971, 44 y.o.   MRN: [id number]  HPI  [name] is a 44 year old female who presents today with a chief complaint of flank pain and dizziness. Her symptoms began yesterday while at work. Her flank pain is located to her right side and is worse today. She also reports urinary frequency, fevers, chills. She is on her menstrual cycle but doesn't believe she's had hematruia. She was treated with Macrobid course on 10/13 for UTI and was also placed on Macrobid 50 mg daily for prophylaxis against frequent UTI's. She's completed her UTI Macrobid course and took 100 mg of Macrobid yesterday and this morning for current symptoms. Overall she's had no improvement despite antibiotics.   Review of Systems  Constitutional: Positive for fever and chills.  HENT: Negative for congestion.   Respiratory: Negative for cough and shortness of breath.   Cardiovascular: Negative for chest pain.  Gastrointestinal: Negative for nausea and vomiting.  Genitourinary: Positive for flank pain. Negative for hematuria, vaginal discharge and difficulty urinating.  Neurological: Positive for light-headedness.       Past Medical History  Diagnosis Date  . Arthritis     bilateral knees  . Sciatica     per patient  . History of chicken pox   . Bipolar disorder (HCC)   . Type 2 diabetes mellitus (HCC) 2006    Diet controlled currently, prior on metformin  . Headache(784.0)   . HTN (hypertension)   . HLD (hyperlipidemia)   . History of kidney stones 2007    s/p lithotripsy, 2 currently, stable, has seen uro in past  . Migraines     prior on maxalt  . History of rheumatic fever     with residual murmur  . Vitamin D deficiency   . OSA (obstructive sleep apnea)     hx this, was on CPAP  . Asthma   . Anxiety     Social History   Social History  . Marital Status: Single    Spouse Name: N/A  . Number of Children: N/A  . Years of Education:  N/A   Occupational History  . Not on file.   Social History Main Topics  . Smoking status: Current Every Day Smoker    Types: Cigars    Start date: 05/09/1995  . Smokeless tobacco: Never Used     Comment: 1 cigar 3-4 x/week off and on for 20 years  . Alcohol Use: 3.0 - 4.8 oz/week    0 Standard drinks or equivalent, 4-6 Glasses of wine, 1-2 Cans of beer, 0 Shots of liquor per week     Comment: occasional  . Drug Use: No  . Sexual Activity:    Partners: Male    Birth Control/ Protection: Condom     Comment: uterine ablation/tn   Other Topics Concern  . Not on file   Social History Narrative   Caffeine: dark sodas occasionally   Lives with daughter, 1 dog   Occupation: disability from bipolar, looking for part time job   Edu: HS   Activity: walking - not recently, some cardio at home   Diet: good water, occasional fruits/vegetables    Past Surgical History  Procedure Laterality Date  . Kidney stone surgery  1989    lithotripsy  . Tubal ligation  1997  . Knee surgery  2009    Right  . Uterine ablation  2010    dysmenorrhea  and menorrhagia    Family History  Problem Relation Age of Onset  . Bipolar disorder Mother   . Diabetes Mother   . Hyperlipidemia Mother   . Hypertension Mother   . Glaucoma Mother   . Stroke Mother   . Coronary artery disease Mother   . Rheum arthritis Mother   . Anxiety disorder Mother   . Depression Mother   . Glaucoma Father   . Thyroid disease Father   . Cancer Paternal Aunt     pancreatic  . Cancer Paternal Grandfather     bone  . Aneurysm Sister 46    brain, deceased  . Drug abuse Sister   . Bipolar disorder Sister   . Depression Sister   . Depression Sister   . Anxiety disorder Sister   . Diabetes Sister     Allergies  Allergen Reactions  . Ciprofloxacin Anaphylaxis  . Sulfa Antibiotics Anaphylaxis    Thinks has taken bactrim in past...  . Penicillins Rash  . Prednisone Rash    ?hives    Current Outpatient  Prescriptions on File Prior to Visit  Medication Sig Dispense Refill  . ACCU-CHEK SOFTCLIX LANCETS lancets Use every other day 100 each prn  . albuterol (PROAIR HFA) 108 (90 BASE) MCG/ACT inhaler Inhale 2 puffs into the lungs every 6 (six) hours as needed. 18 g 3  . citalopram (CELEXA) 40 MG tablet TAKE ONE TABLET BY MOUTH ONE TIME DAILY 90 tablet 2  . cyclobenzaprine (FLEXERIL) 5 MG tablet Take 1 tablet (5 mg total) by mouth at bedtime. 30 tablet 0  . gabapentin (NEURONTIN) 100 MG capsule Take 2-3 capsules (200-300 mg total) by mouth 2 (two) times daily. 180 capsule 1  . glucose blood test strip Use every other day - Accu-Chek 50 each 6  . lamoTRIgine (LAMICTAL) 25 MG tablet Take one tablet in the morning for seven days and then increase to two tablets in the morning. 60 tablet 1  . lisinopril (PRINIVIL,ZESTRIL) 40 MG tablet TAKE ONE TABLET BY MOUTH ONE TIME DAILY 90 tablet 2  . metroNIDAZOLE (METROGEL) 0.75 % vaginal gel Place 1 Applicatorful vaginally 2 (two) times daily. 70 g 0  . nitrofurantoin (MACRODANTIN) 50 MG capsule Take 1 capsule (50 mg total) by mouth daily. 30 capsule 1  . nitrofurantoin, macrocrystal-monohydrate, (MACROBID) 100 MG capsule Take 1 capsule (100 mg total) by mouth 2 (two) times daily. 14 capsule 0   No current facility-administered medications on file prior to visit.    BP 124/70 mmHg  Pulse 102  Temp(Src) 101.5 F (38.6 C) (Oral)  Ht  (1.575 m)  Wt 167 lb 12.8 oz (76.114 kg)  BMI 30.68 kg/m2  SpO2 94%  LMP  (Approximate)    Objective:   Physical Exam  Constitutional: She appears well-nourished.  Cardiovascular: Regular rhythm.   Sinus tachycardia at 101-105  Pulmonary/Chest: Effort normal and breath sounds normal.  Abdominal: Soft. Bowel sounds are normal. There is no tenderness.  CVA tenderness to right flank  Skin: Skin is warm and dry.          Assessment & Plan:  Flank pain:  Present for 24 hours with fevers, chills, dizziness.  Located to right flank. UA: 3+ leuks, 1+ blood. No nitrites, glucose. Suspect renal stone vs pyelonephritis. Will obtain stat CT scan for further evaluation. She does not appear toxic or sickly at this point and is stable for outpatient management. She is "allergic" to several antibiotics including rash with PCN; however  endorses no problems with Keflex in the past. RX sent for 10 day course of Keflex. Push fluids, rest. Work note provided. BMP obtained in August 2016 with normal creatinine. Will contact patient once CT results received.  Will have her follow up closely with PCP for recurrent UTI's.

## 2015-05-21 NOTE — Patient Instructions (Signed)
Stop by the front and speak with Shirlee LimerickMarion or Revonda StandardAllison regarding your CT scan. I want this done today. I will be in touch with you later today.  Start Cephalexin (Keflex) antibiotics. Take 1 capsule by mouth twice daily for 10 days.  Increase consumption of water. You need 2 liters daily.  Please go to the emergency department if you start to feel worse.   It was a pleasure meeting you!

## 2015-05-21 NOTE — Progress Notes (Signed)
Pre visit review using our clinic review tool, if applicable. No additional management support is needed unless otherwise documented below in the visit note. 

## 2015-05-22 ENCOUNTER — Other Ambulatory Visit: Payer: Self-pay | Admitting: Primary Care

## 2015-05-22 DIAGNOSIS — N390 Urinary tract infection, site not specified: Secondary | ICD-10-CM

## 2015-05-22 LAB — URINE CULTURE

## 2015-06-03 DIAGNOSIS — G4733 Obstructive sleep apnea (adult) (pediatric): Secondary | ICD-10-CM | POA: Diagnosis not present

## 2015-06-07 ENCOUNTER — Ambulatory Visit: Payer: Self-pay | Admitting: Obstetrics and Gynecology

## 2015-06-10 ENCOUNTER — Ambulatory Visit: Payer: 59 | Admitting: Psychiatry

## 2015-06-14 ENCOUNTER — Encounter: Payer: Self-pay | Admitting: Obstetrics and Gynecology

## 2015-06-14 ENCOUNTER — Ambulatory Visit (INDEPENDENT_AMBULATORY_CARE_PROVIDER_SITE_OTHER): Payer: Medicare Other | Admitting: Obstetrics and Gynecology

## 2015-06-14 VITALS — BP 118/75 | HR 78 | Ht 62.0 in | Wt 166.7 lb

## 2015-06-14 DIAGNOSIS — N390 Urinary tract infection, site not specified: Secondary | ICD-10-CM

## 2015-06-14 LAB — MICROSCOPIC EXAMINATION
RBC, UA: NONE SEEN /hpf (ref 0–?)
WBC, UA: >30 /hpf — ABNORMAL HIGH (ref 0–?)

## 2015-06-14 LAB — URINALYSIS, COMPLETE
Bilirubin, UA: NEGATIVE
GLUCOSE, UA: NEGATIVE
Ketones, UA: NEGATIVE
Nitrite, UA: NEGATIVE
Specific Gravity, UA: 1.02 (ref 1.005–1.030)
Urobilinogen, Ur: 0.2 mg/dL (ref 0.2–1.0)
pH, UA: 6 (ref 5.0–7.5)

## 2015-06-14 LAB — BLADDER SCAN AMB NON-IMAGING: Scan Result: 174

## 2015-06-14 NOTE — Progress Notes (Signed)
06/14/2015 7:46 AM   Allison Schmitt [birth year] [id number]  Referring provider: Eustaquio Boyden, MD [address] [place], [place] [address]  Chief Complaint  Patient presents with  . Recurrent UTI    new pt    HPI: Patient is a 44yo AA female with a history of renal stones presenting today with complaints of recurrent urinary tract infections. She reports 4 documented UTIs in the past 6 months.  Most recent culture on 05/21/15 showing insignificant growth.   Recent recurrent citrobacter koseri UTI with intermediate sensitivity to nitrofurantion - 4 UTIs growing citrobacter in past 6 months - 3 treated with macrobid, 4th treated with fosfomycin that pt had to pay out of pocket because insurance would not cover. Sxs resolved after treatment. Test of cure UCx 04/22/2015 returned again with 80k citrobacter (without sxs) pointing to colonization.   Regular menstrual cycle.  History of 2 vaginal deliveries. History of recurrent bacterial vaginosis.  She completed her last antibiotic course 2 weeks ago. Her primary provider has referred her for allergy testing    History of 6 stone episodes.  3 ESWL, 1 URS  H2O intake 3-4 16oz bottles of water daily.  PMH: Past Medical History  Diagnosis Date  . Arthritis     bilateral knees  . Sciatica     per patient  . History of chicken pox   . Bipolar disorder (HCC)   . Type 2 diabetes mellitus (HCC) 2006    Diet controlled currently, prior on metformin  . Headache(784.0)   . HTN (hypertension)   . HLD (hyperlipidemia)   . History of kidney stones 2007    s/p lithotripsy, 2 currently, stable, has seen uro in past  . Migraines     prior on maxalt  . History of rheumatic fever     with residual murmur  . Vitamin D deficiency   . OSA (obstructive sleep apnea)     hx this, was on CPAP  . Asthma   . Anxiety   . Anxiety   . Depression   . Heart murmur     Surgical History: Past Surgical History  Procedure Laterality Date  .  Kidney stone surgery  1989    lithotripsy  . Tubal ligation  1997  . Knee surgery  2009    Right  . Uterine ablation  2010    dysmenorrhea and menorrhagia    Home Medications:    Medication List       This list is accurate as of: 06/14/15 11:59 PM.  Always use your most recent med list.               ACCU-CHEK SOFTCLIX LANCETS lancets  Use every other day     albuterol 108 (90 BASE) MCG/ACT inhaler  Commonly known as:  PROAIR HFA  Inhale 2 puffs into the lungs every 6 (six) hours as needed.     citalopram 40 MG tablet  Commonly known as:  CELEXA  TAKE ONE TABLET BY MOUTH ONE TIME DAILY     cyclobenzaprine 5 MG tablet  Commonly known as:  FLEXERIL  Take 1 tablet (5 mg total) by mouth at bedtime.     gabapentin 100 MG capsule  Commonly known as:  NEURONTIN  Take 2-3 capsules (200-300 mg total) by mouth 2 (two) times daily.     glucose blood test strip  Use every other day - Accu-Chek     lamoTRIgine 25 MG tablet  Commonly known as:  LAMICTAL  Take one tablet in the morning for seven days and then increase to two tablets in the morning.     lisinopril 40 MG tablet  Commonly known as:  PRINIVIL,ZESTRIL  TAKE ONE TABLET BY MOUTH ONE TIME DAILY     metroNIDAZOLE 0.75 % vaginal gel  Commonly known as:  METROGEL  Place 1 Applicatorful vaginally 2 (two) times daily.        Allergies:  Allergies  Allergen Reactions  . Ciprofloxacin Anaphylaxis  . Sulfa Antibiotics Anaphylaxis    Thinks has taken bactrim in past...  . Penicillins Rash  . Prednisone Rash    ?hives    Family History: Family History  Problem Relation Age of Onset  . Bipolar disorder Mother   . Diabetes Mother   . Hyperlipidemia Mother   . Hypertension Mother   . Glaucoma Mother   . Stroke Mother   . Coronary artery disease Mother   . Rheum arthritis Mother   . Anxiety disorder Mother   . Depression Mother   . Glaucoma Father   . Thyroid disease Father   . Cancer Paternal Aunt      pancreatic  . Cancer Paternal Grandfather     bone  . Aneurysm Sister 46    brain, deceased  . Drug abuse Sister   . Bipolar disorder Sister   . Depression Sister   . Depression Sister   . Anxiety disorder Sister   . Diabetes Sister     Social History:  reports that she has been smoking Cigars.  She started smoking about 20 years ago. She has never used smokeless tobacco. She reports that she drinks about 3.0 - 4.8 oz of alcohol per week. She reports that she does not use illicit drugs.  ROS: UROLOGY Frequent Urination?: Yes Hard to postpone urination?: Yes Burning/pain with urination?: No Get up at night to urinate?: Yes Leakage of urine?: Yes Urine stream starts and stops?: No Trouble starting stream?: No Do you have to strain to urinate?: No Blood in urine?: No Urinary tract infection?: Yes Sexually transmitted disease?: No Injury to kidneys or bladder?: No Painful intercourse?: Yes Weak stream?: Yes Currently pregnant?: No Vaginal bleeding?: No Last menstrual period?: n  Gastrointestinal Nausea?: Yes Vomiting?: No Indigestion/heartburn?: No Diarrhea?: No Constipation?: No  Constitutional Fever: Yes Night sweats?: Yes Weight loss?: No Fatigue?: Yes  Skin Skin rash/lesions?: No Itching?: No  Eyes Blurred vision?: No Double vision?: No  Ears/Nose/Throat Sore throat?: No Sinus problems?: Yes  Hematologic/Lymphatic Swollen glands?: No Easy bruising?: Yes  Cardiovascular Leg swelling?: Yes Chest pain?: No  Respiratory Cough?: No Shortness of breath?: No  Endocrine Excessive thirst?: Yes  Musculoskeletal Back pain?: Yes Joint pain?: Yes  Neurological Headaches?: Yes Dizziness?: Yes  Psychologic Depression?: Yes Anxiety?: Yes  Physical Exam: BP 118/75 mmHg  Pulse 78  Ht  (1.575 m)  Wt 166 lb 11.2 oz (75.615 kg)  BMI 30.48 kg/m2  LMP  (Approximate)  Constitutional:  Alert and oriented, No acute distress. HEENT: [place] AT,  moist mucus membranes.  Trachea midline, no masses. Cardiovascular: No clubbing, cyanosis, or edema. Respiratory: Normal respiratory effort, no increased work of breathing. GI: Abdomen is soft, nontender, nondistended, no abdominal masses GU: No CVA tenderness.  Pelvic:  External genitalia: vulva /labia no lesions, Tanner stage 5. No cyst on either bartholin's gland.  Urethra: no prolapse, nontender, No erythema Vagina: normal physiologic d/c Rectovaginal: external exam normal Grade 1 cystocele with valsalva, no SUI Skin: No rashes, bruises or  suspicious lesions. Lymph: No cervical or inguinal adenopathy. Neurologic: Grossly intact, no focal deficits, moving all 4 extremities. Psychiatric: Normal mood and affect.  Laboratory Data:   Urinalysis Results for orders placed or performed in visit on 06/14/15  CULTURE, URINE COMPREHENSIVE  Result Value Ref Range   Urine Culture, Comprehensive Final report (A)    Result 1 Citrobacter koseri (A)    ANTIMICROBIAL SUSCEPTIBILITY Comment   Microscopic Examination  Result Value Ref Range   WBC, UA >30 (H) 0 -  5 /hpf   RBC, UA None seen 0 -  2 /hpf   Epithelial Cells (non renal) 0-10 0 - 10 /hpf   Bacteria, UA Many (A) None seen/Few  Urinalysis, Complete  Result Value Ref Range   Specific Gravity, UA 1.020 1.005 - 1.030   pH, UA 6.0 5.0 - 7.5   Color, UA Yellow Yellow   Appearance Ur Clear Clear   Leukocytes, UA 3+ (A) Negative   Protein, UA Trace (A) Negative/Trace   Glucose, UA Negative Negative   Ketones, UA Negative Negative   RBC, UA 1+ (A) Negative   Bilirubin, UA Negative Negative   Urobilinogen, Ur 0.2 0.2 - 1.0 mg/dL   Nitrite, UA Negative Negative   Microscopic Examination See below:   BLADDER SCAN AMB NON-IMAGING  Result Value Ref Range   Scan Result 174     Pertinent Imaging:   Assessment & Plan:    1. Recurrent UTI-  UTI prevention strategies discussed.  Good perineal hygiene reviewed. Patient is encouraged to  increase daily water intake, start cranberry supplements to prevent invasive colonization along the urinary tract and probiotics, especially lactobacillus to restore normal vaginal flora. - Urinalysis, Complete -Urine culture - BLADDER SCAN AMB NON-IMAGING  2. Incomplete Bladder Emptying- PVR 174mL.  Patient instructed to double void 2-3 times daily to completely empty bladder.   3. Cystocele- Mild. Grade 1. Encouraged pelvic floor strengthening exercises.   Return in about 1 month (around 07/14/2015).  These notes generated with voice recognition software. I apologize for typographical errors.  Earlie LouLindsay Carollyn Etcheverry, FNP  [facility] [address], [address] [place], [place] [address] [phone]

## 2015-06-14 NOTE — Patient Instructions (Signed)

## 2015-06-16 LAB — CULTURE, URINE COMPREHENSIVE

## 2015-06-17 ENCOUNTER — Telehealth: Payer: Self-pay | Admitting: Obstetrics and Gynecology

## 2015-06-17 MED ORDER — CEFUROXIME AXETIL 250 MG PO TABS: 250.0000 mg | ORAL_TABLET | Freq: Every day | ORAL | Status: DC

## 2015-06-17 MED ORDER — CEFUROXIME AXETIL 250 MG PO TABS: 250.0000 mg | ORAL_TABLET | Freq: Two times a day (BID) | ORAL | Status: DC

## 2015-06-17 NOTE — Telephone Encounter (Signed)
[facility]

## 2015-06-17 NOTE — Telephone Encounter (Signed)
Spoke with patient. She has tolerated Keflex well in the past and should be okay taking Ceftin. We will treat current urinary tract infection and then start patient on once daily Ceftin for UTI prevention 1 month. She already has an appointment scheduled for follow-up in 1 month. Prescription sent to pharmacy.

## 2015-06-25 DIAGNOSIS — G4733 Obstructive sleep apnea (adult) (pediatric): Secondary | ICD-10-CM | POA: Diagnosis not present

## 2015-06-27 ENCOUNTER — Encounter: Payer: Self-pay | Admitting: Pulmonary Disease

## 2015-07-02 ENCOUNTER — Telehealth: Payer: Self-pay | Admitting: Internal Medicine

## 2015-07-02 DIAGNOSIS — G4733 Obstructive sleep apnea (adult) (pediatric): Secondary | ICD-10-CM

## 2015-07-02 NOTE — Telephone Encounter (Signed)
Please advise on pt's sleep study. It is in the chart. Informed pt I would call once you advised on results.

## 2015-07-03 ENCOUNTER — Other Ambulatory Visit: Payer: Self-pay | Admitting: *Deleted

## 2015-07-03 DIAGNOSIS — J3089 Other allergic rhinitis: Secondary | ICD-10-CM | POA: Diagnosis not present

## 2015-07-03 DIAGNOSIS — T360X5A Adverse effect of penicillins, initial encounter: Secondary | ICD-10-CM | POA: Diagnosis not present

## 2015-07-03 DIAGNOSIS — G4733 Obstructive sleep apnea (adult) (pediatric): Secondary | ICD-10-CM

## 2015-07-03 DIAGNOSIS — T782XXD Anaphylactic shock, unspecified, subsequent encounter: Secondary | ICD-10-CM | POA: Diagnosis not present

## 2015-07-04 NOTE — Telephone Encounter (Signed)
Dr. Nicholos Johnsamachandran, please advise. Thanks.

## 2015-07-07 ENCOUNTER — Encounter: Payer: Self-pay | Admitting: Family Medicine

## 2015-07-09 NOTE — Telephone Encounter (Signed)
Spoke with pt and is aware of results. CPAP titration study ordered. Nothing further needed

## 2015-07-09 NOTE — Telephone Encounter (Signed)
AHI of 59-- severe OSA, pls send pt for cpap titration study.

## 2015-07-10 ENCOUNTER — Ambulatory Visit (INDEPENDENT_AMBULATORY_CARE_PROVIDER_SITE_OTHER): Payer: 59 | Admitting: Psychiatry

## 2015-07-10 ENCOUNTER — Encounter: Payer: Self-pay | Admitting: Psychiatry

## 2015-07-10 VITALS — BP 122/70 | HR 97 | Temp 97.4°F | Ht 62.0 in | Wt 170.0 lb

## 2015-07-10 DIAGNOSIS — F3132 Bipolar disorder, current episode depressed, moderate: Secondary | ICD-10-CM

## 2015-07-10 MED ORDER — BUPROPION HCL ER (XL) 150 MG PO TB24: 150.0000 mg | ORAL_TABLET | ORAL | Status: DC

## 2015-07-10 MED ORDER — LAMOTRIGINE 25 MG PO TABS: ORAL_TABLET | ORAL | Status: DC

## 2015-07-10 NOTE — Progress Notes (Signed)
BH MD/PA/NP OP Progress Note  07/10/2015 2:11 PM Allison Schmitt  MRN:  [id number]  Subjective:  Allison Schmitt returns for follow-up of her bipolar disorder type II. She states she's noticed a "slight" difference since adding the Lamictal at her last visit in October. When asked what difference she is noted she stated maybe she is a little bit depressed ulcers although she states her depression continues. She states she is going to work part-time. However she states there are some mornings where she really feels like it's a struggle to get out of bed. She is continue to take the citalopram. She denies any side effects such as rash with Lamictal. Chief Complaint:  still depressed Chief Complaint    Follow-up; Medication Refill     Visit Diagnosis:     ICD-9-CM ICD-10-CM   1. Bipolar affective disorder, currently depressed, moderate (HCC) 296.52 F31.32     Past Medical History:  Past Medical History  Diagnosis Date  . Arthritis     bilateral knees  . Sciatica     per patient  . History of chicken pox   . Bipolar disorder (HCC)   . Type 2 diabetes mellitus (HCC) 2006    Diet controlled currently, prior on metformin  . Headache(784.0)   . HTN (hypertension)   . HLD (hyperlipidemia)   . History of kidney stones 2007    s/p lithotripsy, 2 currently, stable, has seen uro in past  . Migraines     prior on maxalt  . History of rheumatic fever     with residual murmur  . Vitamin D deficiency   . OSA (obstructive sleep apnea)     hx this, was on CPAP  . Asthma   . Anxiety   . Depression   . Heart murmur     Past Surgical History  Procedure Laterality Date  . Kidney stone surgery  1989    lithotripsy  . Tubal ligation  1997  . Knee surgery  2009    Right  . Uterine ablation  2010    dysmenorrhea and menorrhagia   Family History:  Family History  Problem Relation Age of Onset  . Bipolar disorder Mother   . Diabetes Mother   . Hyperlipidemia Mother   . Hypertension Mother   .  Glaucoma Mother   . Stroke Mother   . Coronary artery disease Mother   . Rheum arthritis Mother   . Anxiety disorder Mother   . Depression Mother   . Glaucoma Father   . Thyroid disease Father   . Cancer Paternal Aunt     pancreatic  . Cancer Paternal Grandfather     bone  . Aneurysm Sister 46    brain, deceased  . Drug abuse Sister   . Bipolar disorder Sister   . Depression Sister   . Depression Sister   . Anxiety disorder Sister   . Diabetes Sister    Social History:  Social History   Social History  . Marital Status: Single    Spouse Name: N/A  . Number of Children: N/A  . Years of Education: N/A   Social History Main Topics  . Smoking status: Current Some Day Smoker    Types: Cigars    Start date: 05/09/1995  . Smokeless tobacco: Never Used     Comment: 1 cigar 3-4 x/week off and on for 20 years  . Alcohol Use: 3.0 - 4.8 oz/week    4-6 Glasses of wine, 1-2 Cans of beer, 0  Shots of liquor, 0 Standard drinks or equivalent per week     Comment: occasional  . Drug Use: No  . Sexual Activity:    Partners: Male    Birth Control/ Protection: Condom     Comment: uterine ablation/tn   Other Topics Concern  . None   Social History Narrative   Caffeine: dark sodas occasionally   Lives with daughter, 1 dog   Occupation: disability from bipolar, looking for part time job   Edu: HS   Activity: walking - not recently, some cardio at home   Diet: good water, occasional fruits/vegetables   Additional History:   Assessment:   Musculoskeletal: Strength & Muscle Tone: within normal limits Gait & Station: normal Patient leans: N/A  Psychiatric Specialty Exam: HPI  Review of Systems  Psychiatric/Behavioral: Positive for depression. Negative for suicidal ideas, hallucinations, memory loss and substance abuse. The patient is not nervous/anxious and does not have insomnia.   All other systems reviewed and are negative.   Blood pressure 122/70, pulse 97, temperature  97.4 F (36.3 C), temperature source Tympanic, height  (1.575 m), weight 170 lb (77.111 kg), SpO2 93 %.Body mass index is 31.09 kg/(m^2).  General Appearance: Well Groomed  Eye Contact:  Good  Speech:  Normal Rate  Volume:  Normal  Mood:  Still depressed  Affect:  constricted with occasional smile  Thought Process:  Linear  Orientation:  Full (Time, Place, and Person)  Thought Content:  Negative  Suicidal Thoughts:  No  Homicidal Thoughts:  No  Memory:  Immediate;   Good Recent;   Good Remote;   Good  Judgement:  Good  Insight:  Good  Psychomotor Activity:  Negative  Concentration:  Good  Recall:  Good  Fund of Knowledge: Good  Language: Good  Akathisia:  Negative  Handed:  Right  AIMS (if indicated):  N/A  Assets:  Communication Skills Desire for Improvement Vocational/Educational  ADL's:  Intact  Cognition: WNL  Sleep:  good   Is the patient at risk to self?  No. Has the patient been a risk to self in the past 6 months?  No. Has the patient been a risk to self within the distant past?  Yes.   Is the patient a risk to others?  No. Has the patient been a risk to others in the past 6 months?  No. Has the patient been a risk to others within the distant past?  No.  Current Medications: Current Outpatient Prescriptions  Medication Sig Dispense Refill  . ACCU-CHEK SOFTCLIX LANCETS lancets Use every other day 100 each prn  . albuterol (PROAIR HFA) 108 (90 BASE) MCG/ACT inhaler Inhale 2 puffs into the lungs every 6 (six) hours as needed. 18 g 3  . citalopram (CELEXA) 40 MG tablet TAKE ONE TABLET BY MOUTH ONE TIME DAILY 90 tablet 2  . Fluticasone-Salmeterol (ADVAIR DISKUS) 250-50 MCG/DOSE AEPB Inhale into the lungs.    Marland Kitchen glucose blood test strip Use every other day - Accu-Chek 50 each 6  . lamoTRIgine (LAMICTAL) 25 MG tablet Take three tablets in the morning for seven days, and then increase to four tablets in the morning. 120 tablet 1  . lisinopril (PRINIVIL,ZESTRIL)  40 MG tablet TAKE ONE TABLET BY MOUTH ONE TIME DAILY 90 tablet 2  . buPROPion (WELLBUTRIN XL) 150 MG 24 hr tablet Take 1 tablet (150 mg total) by mouth every morning. 30 tablet 1   No current facility-administered medications for this visit.    Medical  Decision Making:  Established Problem, Stable/Improving (1), Review of Medication Regimen & Side Effects (2) and Review of New Medication or Change in Dosage (2)  Treatment Plan Summary:Medication management and Plan  Bipolar disorder type II-patient reports largely being a depressed state right now. She reports perhaps only on slight benefit with the addition of Lamictal now at 50 mg. We will continue to titrate her upwards. She will take 75 mg in the morning for 7 days and then go to 100 mg in the morning. She will continue her citalopram. She is talked about difficulty with getting the drive and motivation go to work in the morning. Thus we will start some Wellbutrin XL 150 mg in the morning. Risk and benefits of been discussing patient's able to consent.  She'll follow up in 1 month. I've informed patient I will be leaving the practice in February 2016 ever there will be another doctor to continue her care in the practice.   Wallace Goinglton Gatlin Kittell 07/10/2015, 2:11 PM

## 2015-07-15 ENCOUNTER — Ambulatory Visit: Payer: Self-pay | Admitting: Obstetrics and Gynecology

## 2015-07-16 ENCOUNTER — Ambulatory Visit (HOSPITAL_BASED_OUTPATIENT_CLINIC_OR_DEPARTMENT_OTHER): Payer: Medicare Other | Attending: Internal Medicine | Admitting: Radiology

## 2015-07-16 DIAGNOSIS — I493 Ventricular premature depolarization: Secondary | ICD-10-CM | POA: Diagnosis not present

## 2015-07-16 DIAGNOSIS — G4730 Sleep apnea, unspecified: Secondary | ICD-10-CM | POA: Insufficient documentation

## 2015-07-16 DIAGNOSIS — G4761 Periodic limb movement disorder: Secondary | ICD-10-CM | POA: Diagnosis not present

## 2015-07-16 DIAGNOSIS — G4733 Obstructive sleep apnea (adult) (pediatric): Secondary | ICD-10-CM | POA: Diagnosis not present

## 2015-07-25 DIAGNOSIS — G4733 Obstructive sleep apnea (adult) (pediatric): Secondary | ICD-10-CM

## 2015-07-25 NOTE — Progress Notes (Signed)
Patient Name: Allison Schmitt, [place] Study Date: 07/16/2015 Gender: Female D.O.B: [birth year] Age (years): [age] Referring Provider: Shane CrutchPradeep Ramachandran Height (inches): 62 Interpreting Physician: Coralyn HellingVineet Ralphine Hinks MD, ABSM Weight (lbs): 169 RPSGT: Ulyess MortSpruill, Vicki BMI: 31 MRN: [id number] Neck Size: 14.00  CLINICAL INFORMATION The patient is referred for a CPAP titration to treat sleep apnea.   Date of NPSG, Split Night or HST: 07/03/15 >> AHI 58.  SLEEP STUDY TECHNIQUE As per the AASM Manual for the Scoring of Sleep and Associated Events v2.3 (April 2016) with a hypopnea requiring 4% desaturations. The channels recorded and monitored were frontal, central and occipital EEG, electrooculogram (EOG), submentalis EMG (chin), nasal and oral airflow, thoracic and abdominal wall motion, anterior tibialis EMG, snore microphone, electrocardiogram, and pulse oximetry. Continuous positive airway pressure (CPAP) was initiated at the beginning of the study and titrated to treat sleep-disordered breathing.  MEDICATIONS Medications taken by the patient : reviewed in electronic medical record. Medications administered by patient during sleep study : No sleep medicine administered.  TECHNICIAN COMMENTS Comments added by technician: Patient was ordered as a cpap titration.  Comments added by scorer: N/A  RESPIRATORY PARAMETERS Optimal PAP Pressure (cm): 9 AHI at Optimal Pressure (/hr): 0 Overall Minimal O2 (%): 86.00  SLEEP ARCHITECTURE The study was initiated at 10:58:19 PM and ended at 5:21:17 AM. Sleep onset time was 44.9 minutes and the sleep efficiency was 69.6%. The total sleep time was 266.5 minutes. The patient spent 12.20% of the night in stage N1 sleep, 80.49% in stage N2 sleep, 0.38% in stage N3 and 6.94% in REM.Stage REM latency was 143.0 minutes Wake after sleep onset was 71.5. Alpha intrusion was absent. Supine sleep was 36.68%.  CARDIAC DATA The 2 lead EKG demonstrated sinus rhythm. The mean  heart rate was 77.60 beats per minute. Other EKG findings include: PVCs.  LEG MOVEMENT DATA The total Periodic Limb Movements of Sleep (PLMS) were 97. The PLMS index was 21.84. A PLMS index of <15 is considered normal in adults.  IMPRESSIONS She had good control of her sleep apnea with CPAP 9 cm H2O.  She had an increase in her periodic limb movement index.  DIAGNOSIS - Obstructive Sleep Apnea (327.23 [G47.33 ICD-10])  RECOMMENDATIONS Weight loss.  CPAP 9 cm H2O.  She was fitted with a medium size [employer] Respironics Nasal Mask DreamWear mask and heated humidification.  Assess for presence of restless leg syndrome.   Coralyn HellingVineet Elisandro Jarrett, MD, ABSM Diplomate, American Board of Sleep Medicine 07/25/2015, 3:17 PM  NPI: [id number]

## 2015-07-26 ENCOUNTER — Ambulatory Visit: Payer: Self-pay | Admitting: Obstetrics and Gynecology

## 2015-07-30 ENCOUNTER — Telehealth: Payer: Self-pay | Admitting: Internal Medicine

## 2015-07-30 DIAGNOSIS — G4733 Obstructive sleep apnea (adult) (pediatric): Secondary | ICD-10-CM

## 2015-07-30 NOTE — Telephone Encounter (Signed)
Pt informed of results and that she will be setup on a CPAP. Also informed that she will need an appt 31-90 days after CPAP setup. Nothing further needed.

## 2015-07-30 NOTE — Telephone Encounter (Signed)
Waiting on CPAP titration results to be faxed.

## 2015-07-30 NOTE — Telephone Encounter (Signed)
Please advise on sleep study results

## 2015-08-09 ENCOUNTER — Encounter: Payer: Self-pay | Admitting: Psychiatry

## 2015-08-09 ENCOUNTER — Ambulatory Visit (INDEPENDENT_AMBULATORY_CARE_PROVIDER_SITE_OTHER): Payer: 59 | Admitting: Psychiatry

## 2015-08-09 VITALS — BP 122/80 | HR 92 | Temp 99.0°F | Ht 62.0 in | Wt 170.4 lb

## 2015-08-09 DIAGNOSIS — F3132 Bipolar disorder, current episode depressed, moderate: Secondary | ICD-10-CM

## 2015-08-09 MED ORDER — LAMOTRIGINE 100 MG PO TABS: 150.0000 mg | ORAL_TABLET | Freq: Every day | ORAL | Status: DC

## 2015-08-09 NOTE — Progress Notes (Signed)
BH MD/PA/NP OP Progress Note  08/09/2015 2:33 PM GISSELLA Schmitt  MRN:  [id number]  Subjective:  Allison Schmitt returns for follow-up of her bipolar disorder type II. She indicates she feels like the increase in her Lamictal has helped her. She states she still feels depressed and down but it's no longer to the point where she cries. She states she continues to go to work at her part-time job. At the last visit we also started Wellbutrin to address her complaints of depression but she states she does not like it. She states it made her feel jumpy and caused her skin to it. Chief Complaint:  Want to stop taking the Wellbutrin Chief Complaint    Follow-up; Medication Refill     Visit Diagnosis:     ICD-9-CM ICD-10-CM   1. Bipolar affective disorder, currently depressed, moderate (HCC) 296.52 F31.32     Past Medical History:  Past Medical History  Diagnosis Date  . Arthritis     bilateral knees  . Sciatica     per patient  . History of chicken pox   . Bipolar disorder (HCC)   . Type 2 diabetes mellitus (HCC) 2006    Diet controlled currently, prior on metformin  . Headache(784.0)   . HTN (hypertension)   . HLD (hyperlipidemia)   . History of kidney stones 2007    s/p lithotripsy, 2 currently, stable, has seen uro in past  . Migraines     prior on maxalt  . History of rheumatic fever     with residual murmur  . Vitamin D deficiency   . OSA (obstructive sleep apnea)     hx this, was on CPAP  . Asthma   . Anxiety   . Depression   . Heart murmur     Past Surgical History  Procedure Laterality Date  . Kidney stone surgery  1989    lithotripsy  . Tubal ligation  1997  . Knee surgery  2009    Right  . Uterine ablation  2010    dysmenorrhea and menorrhagia   Family History:  Family History  Problem Relation Age of Onset  . Bipolar disorder Mother   . Diabetes Mother   . Hyperlipidemia Mother   . Hypertension Mother   . Glaucoma Mother   . Stroke Mother   . Coronary artery  disease Mother   . Rheum arthritis Mother   . Anxiety disorder Mother   . Depression Mother   . Glaucoma Father   . Thyroid disease Father   . Cancer Paternal Aunt     pancreatic  . Cancer Paternal Grandfather     bone  . Aneurysm Sister 46    brain, deceased  . Drug abuse Sister   . Bipolar disorder Sister   . Depression Sister   . Depression Sister   . Anxiety disorder Sister   . Diabetes Sister    Social History:  Social History   Social History  . Marital Status: Single    Spouse Name: N/A  . Number of Children: N/A  . Years of Education: N/A   Social History Main Topics  . Smoking status: Former Smoker    Types: Cigars    Start date: 05/09/1995    Quit date: 08/02/2015  . Smokeless tobacco: Never Used     Comment: 1 cigar 3-4 x/week off and on for 20 years  . Alcohol Use: 3.0 - 4.8 oz/week    4-6 Glasses of wine, 1-2 Cans of beer,  0 Shots of liquor, 0 Standard drinks or equivalent per week     Comment: occasional  . Drug Use: No  . Sexual Activity:    Partners: Male    Birth Control/ Protection: Condom     Comment: uterine ablation/tn   Other Topics Concern  . None   Social History Narrative   Caffeine: dark sodas occasionally   Lives with daughter, 1 dog   Occupation: disability from bipolar, looking for part time job   Edu: HS   Activity: walking - not recently, some cardio at home   Diet: good water, occasional fruits/vegetables   Additional History:   Assessment:   Musculoskeletal: Strength & Muscle Tone: within normal limits Gait & Station: normal Patient leans: N/A  Psychiatric Specialty Exam: HPI  Review of Systems  Psychiatric/Behavioral: Positive for depression. Negative for suicidal ideas, hallucinations, memory loss and substance abuse. The patient is not nervous/anxious and does not have insomnia.   All other systems reviewed and are negative.   Blood pressure 122/80, pulse 92, temperature 99 F (37.2 C), temperature source  Tympanic, height 5\' 2"  (1.575 m), weight 170 lb 6.4 oz (77.293 kg), SpO2 93 %.Body mass index is 31.16 kg/(m^2).  General Appearance: Well Groomed  Eye Contact:  Good  Speech:  Normal Rate  Volume:  Normal  Mood:  Better but still depressed  Affect:  Reactive slightly brighter than last visit  Thought Process:  Linear  Orientation:  Full (Time, Place, and Person)  Thought Content:  Negative  Suicidal Thoughts:  No  Homicidal Thoughts:  No  Memory:  Immediate;   Good Recent;   Good Remote;   Good  Judgement:  Good  Insight:  Good  Psychomotor Activity:  Negative  Concentration:  Good  Recall:  Good  Fund of Knowledge: Good  Language: Good  Akathisia:  Negative  Handed:  Right  AIMS (if indicated):  N/A  Assets:  Communication Skills Desire for Improvement Vocational/Educational  ADL's:  Intact  Cognition: WNL  Sleep:  good   Is the patient at risk to self?  No. Has the patient been a risk to self in the past 6 months?  No. Has the patient been a risk to self within the distant past?  Yes.   Is the patient a risk to others?  No. Has the patient been a risk to others in the past 6 months?  No. Has the patient been a risk to others within the distant past?  No.  Current Medications: Current Outpatient Prescriptions  Medication Sig Dispense Refill  . ACCU-CHEK SOFTCLIX LANCETS lancets Use every other day 100 each prn  . albuterol (PROAIR HFA) 108 (90 BASE) MCG/ACT inhaler Inhale 2 puffs into the lungs every 6 (six) hours as needed. 18 g 3  . citalopram (CELEXA) 40 MG tablet TAKE ONE TABLET BY MOUTH ONE TIME DAILY 90 tablet 2  . Fluticasone-Salmeterol (ADVAIR DISKUS) 250-50 MCG/DOSE AEPB Inhale into the lungs.    Marland Kitchen glucose blood test strip Use every other day - Accu-Chek 50 each 6  . lamoTRIgine (LAMICTAL) 100 MG tablet Take 1.5 tablets (150 mg total) by mouth daily. 45 tablet 1  . lisinopril (PRINIVIL,ZESTRIL) 40 MG tablet TAKE ONE TABLET BY MOUTH ONE TIME DAILY 90 tablet 2    No current facility-administered medications for this visit.    Medical Decision Making:  Established Problem, Stable/Improving (1), Review of Medication Regimen & Side Effects (2) and Review of New Medication or Change in Dosage (2)  Treatment Plan Summary:Medication management and Plan  Bipolar disorder type II-patient reports proven in her depressed state. Thus we're going to increase her Lamictal from 100 mg to 150 mg. We'll discontinue the Wellbutrin as she states she has not liked the way it made her feel and that caused her skin to itch.  She'll follow up in 1 month. I've informed patient I will be leaving the practice in February 2016 she is aware she'll see me one more time prior to my departure.   Wallace Goinglton Aaminah Forrester 08/09/2015, 2:33 PM

## 2015-08-13 ENCOUNTER — Encounter: Payer: Self-pay | Admitting: Obstetrics and Gynecology

## 2015-08-13 ENCOUNTER — Ambulatory Visit (INDEPENDENT_AMBULATORY_CARE_PROVIDER_SITE_OTHER): Payer: Medicare Other | Admitting: Obstetrics and Gynecology

## 2015-08-13 VITALS — BP 124/83 | HR 86 | Temp 99.3°F | Resp 16 | Ht 62.0 in | Wt 171.5 lb

## 2015-08-13 DIAGNOSIS — R300 Dysuria: Secondary | ICD-10-CM | POA: Diagnosis not present

## 2015-08-13 DIAGNOSIS — N3000 Acute cystitis without hematuria: Secondary | ICD-10-CM

## 2015-08-13 LAB — MICROSCOPIC EXAMINATION
RBC, UA: NONE SEEN /hpf (ref 0–?)
WBC, UA: >30 /hpf — ABNORMAL HIGH (ref 0–?)

## 2015-08-13 LAB — URINALYSIS, COMPLETE
BILIRUBIN UA: NEGATIVE
GLUCOSE, UA: NEGATIVE
KETONES UA: NEGATIVE
Nitrite, UA: NEGATIVE
PROTEIN UA: NEGATIVE
SPEC GRAV UA: 1.015 (ref 1.005–1.030)
Urobilinogen, Ur: 0.2 mg/dL (ref 0.2–1.0)
pH, UA: 5.5 (ref 5.0–7.5)

## 2015-08-13 MED ORDER — CEFUROXIME AXETIL 250 MG PO TABS: 250.0000 mg | ORAL_TABLET | Freq: Two times a day (BID) | ORAL | Status: DC

## 2015-08-13 NOTE — Patient Instructions (Signed)

## 2015-08-13 NOTE — Progress Notes (Signed)
1:36 PM   Allison Schmitt 30-Oct-1970 [id number]  Referring provider: Eustaquio Boyden, MD [address] [place], [place] [address]  Chief Complaint  Patient presents with  . Dysuria     08/13/2015 1:36 PM   Allison Schmitt Jul 11, 1971 [id number]  Referring provider: Eustaquio Boyden, MD [address] [place], [place] [address]  Chief Complaint  Patient presents with  . Dysuria    HPI: Patient is a 45yo AA female with a history of renal stones presenting today with complaints of recurrent urinary tract infections. She reports 4 documented UTIs in the past 6 months.  Most recent culture on 05/21/15 showing insignificant growth.   Recent recurrent citrobacter koseri UTI with intermediate sensitivity to nitrofurantion - 4 UTIs growing citrobacter in past 6 months - 3 treated with macrobid, 4th treated with fosfomycin that pt had to pay out of pocket because insurance would not cover. Sxs resolved after treatment. Test of cure UCx 04/22/2015 returned again with 80k citrobacter (without sxs) pointing to colonization.   Regular menstrual cycle.  History of 2 vaginal deliveries. History of recurrent bacterial vaginosis.  She completed her last antibiotic course 2 weeks ago. Her primary provider has referred her for allergy testing    History of 6 stone episodes.  3 ESWL, 1 URS  H2O intake 3-4 16oz bottles of water daily.  Current Status: Patient presents for an acute visit with complaints of urinary frequency, right flank pain, intermittency, mild dysuria and low grade fever since Friday. She denies any gross hematuria or nausea with vomiting. No exacerbating or alleviating factors.    PMH: Past Medical History  Diagnosis Date  . Arthritis     bilateral knees  . Sciatica     per patient  . History of chicken pox   . Bipolar disorder (HCC)   . Type 2 diabetes mellitus (HCC) 2006    Diet controlled currently, prior on metformin  . Headache(784.0)   . HTN  (hypertension)   . HLD (hyperlipidemia)   . History of kidney stones 2007    s/p lithotripsy, 2 currently, stable, has seen uro in past  . Migraines     prior on maxalt  . History of rheumatic fever     with residual murmur  . Vitamin D deficiency   . OSA (obstructive sleep apnea)     hx this, was on CPAP  . Asthma   . Anxiety   . Depression   . Heart murmur     Surgical History: Past Surgical History  Procedure Laterality Date  . Kidney stone surgery  1989    lithotripsy  . Tubal ligation  1997  . Knee surgery  2009    Right  . Uterine ablation  2010    dysmenorrhea and menorrhagia    Home Medications:    Medication List       This list is accurate as of: 08/13/15 11:59 PM.  Always use your most recent med list.               ACCU-CHEK SOFTCLIX LANCETS lancets  Use every other day     ADVAIR DISKUS 250-50 MCG/DOSE Aepb  Generic drug:  Fluticasone-Salmeterol  Inhale into the lungs.     albuterol 108 (90 Base) MCG/ACT inhaler  Commonly known as:  PROAIR HFA  Inhale 2 puffs into the lungs every 6 (six) hours as needed.     cefUROXime 250 MG tablet  Commonly known as:  CEFTIN  Take  1 tablet (250 mg total) by mouth 2 (two) times daily with a meal.     citalopram 40 MG tablet  Commonly known as:  CELEXA  TAKE ONE TABLET BY MOUTH ONE TIME DAILY     glucose blood test strip  Use every other day - Accu-Chek     lamoTRIgine 100 MG tablet  Commonly known as:  LAMICTAL  Take 1.5 tablets (150 mg total) by mouth daily.     lisinopril 40 MG tablet  Commonly known as:  PRINIVIL,ZESTRIL  TAKE ONE TABLET BY MOUTH ONE TIME DAILY        Allergies:  Allergies  Allergen Reactions  . Ciprofloxacin Anaphylaxis  . Penicillins Rash    + intradermal allergy testing to PCN/amoxicillin by allergist. Has tolerated cephalosporins.    Family History: Family History  Problem Relation Age of Onset  . Bipolar disorder Mother   . Diabetes Mother   . Hyperlipidemia  Mother   . Hypertension Mother   . Glaucoma Mother   . Stroke Mother   . Coronary artery disease Mother   . Rheum arthritis Mother   . Anxiety disorder Mother   . Depression Mother   . Glaucoma Father   . Thyroid disease Father   . Cancer Paternal Aunt     pancreatic  . Cancer Paternal Grandfather     bone  . Aneurysm Sister 46    brain, deceased  . Drug abuse Sister   . Bipolar disorder Sister   . Depression Sister   . Depression Sister   . Anxiety disorder Sister   . Diabetes Sister     Social History:  reports that she quit smoking about 1 weeks ago. Her smoking use included Cigars. She started smoking about 20 years ago. She has never used smokeless tobacco. She reports that she drinks about 3.0 - 4.8 oz of alcohol per week. She reports that she does not use illicit drugs.  ROS: UROLOGY Frequent Urination?: Yes Hard to postpone urination?: Yes Burning/pain with urination?: No Get up at night to urinate?: Yes Leakage of urine?: Yes Urine stream starts and stops?: No Trouble starting stream?: No Do you have to strain to urinate?: Yes Blood in urine?: No Urinary tract infection?: No Sexually transmitted disease?: No Injury to kidneys or bladder?: No Painful intercourse?: No Weak stream?: Yes Currently pregnant?: No Vaginal bleeding?: No Last menstrual period?: n  Gastrointestinal Nausea?: No Vomiting?: No Indigestion/heartburn?: No Diarrhea?: No Constipation?: No  Constitutional Fever: Yes Night sweats?: Yes Weight loss?: No Fatigue?: No  Skin Skin rash/lesions?: No Itching?: No  Eyes Blurred vision?: No Double vision?: No  Ears/Nose/Throat Sore throat?: No Sinus problems?: No  Hematologic/Lymphatic Swollen glands?: No Easy bruising?: No  Cardiovascular Leg swelling?: Yes Chest pain?: No  Respiratory Cough?: No Shortness of breath?: No  Endocrine Excessive thirst?: Yes  Musculoskeletal Back pain?: Yes Joint pain?:  No  Neurological Headaches?: No Dizziness?: No  Psychologic Depression?: Yes Anxiety?: Yes  Physical Exam: BP 124/83 mmHg  Pulse 86  Temp(Src) 99.3 F (37.4 C)  Resp 16  Ht  (1.575 m)  Wt 171 lb 8 oz (77.792 kg)  BMI 31.36 kg/m2  Constitutional:  Alert and oriented, No acute distress. HEENT: [place] AT, moist mucus membranes.  Trachea midline, no masses. Respiratory: Normal respiratory effort, no increased work of breathing. GI: Abdomen is soft, nontender, nondistended, no abdominal masses GU: No CVA tenderness. Skin: No rashes, bruises or suspicious lesions. Neurologic: Grossly intact, no focal deficits, moving  all 4 extremities. Psychiatric: Normal mood and affect.  Laboratory Data:   Urinalysis Results for orders placed or performed in visit on 08/13/15  Microscopic Examination  Result Value Ref Range   WBC, UA >30 (H) 0 -  5 /hpf   RBC, UA None seen 0 -  2 /hpf   Epithelial Cells (non renal) 0-10 0 - 10 /hpf   Mucus, UA Present (A) Not Estab.   Bacteria, UA Many (A) None seen/Few  Urinalysis, Complete  Result Value Ref Range   Specific Gravity, UA 1.015 1.005 - 1.030   pH, UA 5.5 5.0 - 7.5   Color, UA Yellow Yellow   Appearance Ur Cloudy (A) Clear   Leukocytes, UA 2+ (A) Negative   Protein, UA Negative Negative/Trace   Glucose, UA Negative Negative   Ketones, UA Negative Negative   RBC, UA Trace (A) Negative   Bilirubin, UA Negative Negative   Urobilinogen, Ur 0.2 0.2 - 1.0 mg/dL   Nitrite, UA Negative Negative   Microscopic Examination See below:      Pertinent Imaging:   Assessment & Plan:    1. Recurrent UTI- Given patient's low-grade temp we will go ahead and begin treatment with Ceftin pending culture results.  Patient advised to seek immediate medical attention for fevers over 101, severe flank pain, nausea or uncontrolled vomiting.  - Urinalysis, Complete -Urine culture - BLADDER SCAN AMB NON-IMAGING  Return in about 3 months (around  11/11/2015).  These notes generated with voice recognition software. I apologize for typographical errors.  Earlie Lou, FNP  [facility] [address], [address] [place], [place] [address] [phone]

## 2015-08-14 ENCOUNTER — Telehealth: Payer: Self-pay | Admitting: Obstetrics and Gynecology

## 2015-08-14 NOTE — Telephone Encounter (Signed)
Spoke with pt in reference to ucx. Made aware to call us back if she is not feeling better. Pt voiced understanding.

## 2015-08-14 NOTE — Telephone Encounter (Signed)
Please notify patient that her urine unfortunately failed to get sent for culture yesterday. Please notify her that if she does not begin to feel better in a few days or begins to feel worse she needs to come in and give [place] another specimen. The medication I gave her should work according to her last culture results.   thanks

## 2015-08-21 DIAGNOSIS — G4733 Obstructive sleep apnea (adult) (pediatric): Secondary | ICD-10-CM | POA: Diagnosis not present

## 2015-08-26 ENCOUNTER — Encounter: Payer: Self-pay | Admitting: Family Medicine

## 2015-08-28 ENCOUNTER — Encounter: Payer: Self-pay | Admitting: Family Medicine

## 2015-08-28 ENCOUNTER — Ambulatory Visit (INDEPENDENT_AMBULATORY_CARE_PROVIDER_SITE_OTHER): Payer: Medicare Other | Admitting: Family Medicine

## 2015-08-28 VITALS — BP 120/70 | HR 92 | Temp 99.2°F | Wt 174.8 lb

## 2015-08-28 DIAGNOSIS — R10A0 Flank pain, unspecified side: Secondary | ICD-10-CM

## 2015-08-28 DIAGNOSIS — N760 Acute vaginitis: Secondary | ICD-10-CM

## 2015-08-28 DIAGNOSIS — N9489 Other specified conditions associated with female genital organs and menstrual cycle: Secondary | ICD-10-CM

## 2015-08-28 DIAGNOSIS — N898 Other specified noninflammatory disorders of vagina: Secondary | ICD-10-CM

## 2015-08-28 DIAGNOSIS — N390 Urinary tract infection, site not specified: Secondary | ICD-10-CM

## 2015-08-28 DIAGNOSIS — R109 Unspecified abdominal pain: Secondary | ICD-10-CM | POA: Diagnosis not present

## 2015-08-28 LAB — POC URINALSYSI DIPSTICK (AUTOMATED)
BILIRUBIN UA: NEGATIVE
GLUCOSE UA: NEGATIVE
KETONES UA: NEGATIVE
NITRITE UA: NEGATIVE
PH UA: 6
Protein, UA: NEGATIVE
RBC UA: NEGATIVE
SPEC GRAV UA: 1.015
Urobilinogen, UA: 0.2

## 2015-08-28 MED ORDER — SULFAMETHOXAZOLE-TRIMETHOPRIM 800-160 MG PO TABS: 1.0000 | ORAL_TABLET | Freq: Two times a day (BID) | ORAL | Status: DC

## 2015-08-28 MED ORDER — CEFTRIAXONE SODIUM 1 G IJ SOLR
1.0000 g | Freq: Once | INTRAMUSCULAR | Status: AC
  Administered 2015-08-28: 1 g via INTRAMUSCULAR

## 2015-08-28 NOTE — Telephone Encounter (Signed)
Seen today. 

## 2015-08-28 NOTE — Progress Notes (Signed)
BP 120/70 mmHg  Pulse 92  Temp(Src) 99.2 F (37.3 C) (Oral)  Wt 174 lb 12 oz (79.266 kg)   CC: ?UTI  Subjective:    Patient ID: Allison Schmitt, female    DOB: 10-16-1970, 45 y.o.   MRN: [id number]  HPI: Allison Schmitt is a 45 y.o. female presenting on 08/28/2015 for Vaginitis and Urinary Tract Infection   1d h/o body aches, incomplete emptying with increased urgency and frequency, some right flank pain. Some abdominal cramping. Symptoms started after drinking beer. She has not yet tried probiotic or cranberry juice.   No current vaginal discharge but notices smell. Denies vaginal itching or rash. Trouble with twice weekly preventative metrogel (messy, especially during sex).   No dysuria, hematuria, fevers/chills, abd pain or nausea.   H/o recurrent BV infection - reviewed chart, 7 metronidazole treatments (oral or topical) over the last year. After last BV infection 04/2015 we started twice weekly suppressive vaginal metrogel.   Recent recurrent citrobacter koseri UTI with intermediate sensitivity to nitrofurantion - 4 UTIs growing citrobacter in past 6 months - 3 treated with macrobid, 4th treated with fosfomycin that pt had to pay out of pocket because insurance would not cover. Sxs resolved after treatment. Test of cure UCx 04/22/2015 returned again with 80k citrobacter (without sxs) pointing to colonization. Last UTI 04/2015 again with citrobacter - treated and started on  daily preventative nitrofurantion. She did have mild right pyelonephritis by CT scan 05/21/2015, treated with keflex 7d course. She also had chronic right renal scarring/atrophy.   Saw urologist 05/2015 - mild cystocele, rec probiotic, cranberry (has not started yet). Latest UTI treated with ceftin 7d course 08/13/2015. Completed treatment, sxs resolved.   Saw allergist for multiple antibiotic allergies - bactrim was found to be an option, rec continue to avoid PCN and Cipro. She has tolerated cephalosporins in  the past.   Relevant past medical, surgical, family and social history reviewed and updated as indicated. Interim medical history since our last visit reviewed. Allergies and medications reviewed and updated. Current Outpatient Prescriptions on File Prior to Visit  Medication Sig  . ACCU-CHEK SOFTCLIX LANCETS lancets Use every other day  . albuterol (PROAIR HFA) 108 (90 BASE) MCG/ACT inhaler Inhale 2 puffs into the lungs every 6 (six) hours as needed.  . citalopram (CELEXA) 40 MG tablet TAKE ONE TABLET BY MOUTH ONE TIME DAILY  . Fluticasone-Salmeterol (ADVAIR DISKUS) 250-50 MCG/DOSE AEPB Inhale into the lungs.  Marland Kitchen glucose blood test strip Use every other day - Accu-Chek  . lamoTRIgine (LAMICTAL) 100 MG tablet Take 1.5 tablets (150 mg total) by mouth daily.  Marland Kitchen lisinopril (PRINIVIL,ZESTRIL) 40 MG tablet TAKE ONE TABLET BY MOUTH ONE TIME DAILY   No current facility-administered medications on file prior to visit.    Review of Systems Per HPI unless specifically indicated in ROS section     Objective:    BP 120/70 mmHg  Pulse 92  Temp(Src) 99.2 F (37.3 C) (Oral)  Wt 174 lb 12 oz (79.266 kg)  Wt Readings from Last 3 Encounters:  08/28/15 174 lb 12 oz (79.266 kg)  08/13/15 171 lb 8 oz (77.792 kg)  08/09/15 170 lb 6.4 oz (77.293 kg)    Physical Exam  Constitutional: She appears well-developed and well-nourished. No distress.  Abdominal: Soft. Normal appearance and bowel sounds are normal. She exhibits no distension and no mass. There is no hepatosplenomegaly. There is tenderness in the suprapubic area. There is CVA tenderness (R). There is no  rigidity, no rebound, no guarding and negative Murphy's sign.  Genitourinary: Vagina normal. Pelvic exam was performed with patient supine. There is no rash, tenderness or lesion on the right labia. There is no rash, tenderness or lesion on the left labia. No erythema or tenderness in the vagina. No signs of injury around the vagina. No vaginal  discharge found.  Wet prep performed and sent off  Musculoskeletal: She exhibits no edema.  Psychiatric: She has a normal mood and affect.  Nursing note and vitals reviewed.  Results for orders placed or performed in visit on 08/28/15  POCT Urinalysis Dipstick (Automated)  Result Value Ref Range   Color, UA Yellow    Clarity, UA Hazy    Glucose, UA Negative    Bilirubin, UA Negative    Ketones, UA Negative    Spec Grav, UA 1.015    Blood, UA Negative    pH, UA 6.0    Protein, UA Negative    Urobilinogen, UA 0.2    Nitrite, UA Negative    Leukocytes, UA moderate (2+) (A) Negative      Assessment & Plan:   Problem List Items Addressed This Visit    Recurrent vaginitis    Wet prep sent. Having trouble with twice weekly metrogel.       Recurrent UTI (urinary tract infection) - Primary    Recent treatment by urology with 7d ceftin course - sxs improved but quickly recurred. Recommend she start cranberry and probiotic today.  Given systemic sxs and recent abnormal CT (scarring/atrophy of R kidney in setting of pyelo 04/2015) will treat aggressively with CTX 1gm IM and then bactrim course. UCx sent.      Relevant Orders   Urine culture    Other Visit Diagnoses    Flank pain        Relevant Orders    POCT Urinalysis Dipstick (Automated) (Completed)    Urine culture    Vaginal odor        Relevant Orders    WET PREP FOR TRICH, YEAST, CLUE        Follow up plan: Return in about 3 months (around 11/25/2015) for annual exam, prior fasting for blood work.

## 2015-08-28 NOTE — Patient Instructions (Addendum)
Possible recurrent UTI - treat with bactrim course sent to pharmacy. Take with food. Urine culture sent. Wet prep sent.  Start probiotic and cranberry supplement daily.  Return in 3 months for physical and labs prior.

## 2015-08-28 NOTE — Addendum Note (Signed)
Addended by: Josph Macho A on: 08/28/2015 11:09 AM   Modules accepted: Orders

## 2015-08-28 NOTE — Assessment & Plan Note (Signed)
Recent treatment by urology with 7d ceftin course - sxs improved but quickly recurred. Recommend she start cranberry and probiotic today.  Given systemic sxs and recent abnormal CT (scarring/atrophy of R kidney in setting of pyelo 04/2015) will treat aggressively with CTX 1gm IM and then bactrim course. UCx sent.

## 2015-08-28 NOTE — Progress Notes (Signed)
Pre visit review using our clinic review tool, if applicable. No additional management support is needed unless otherwise documented below in the visit note. 

## 2015-08-28 NOTE — Addendum Note (Signed)
Addended by: Eustaquio Boyden on: 08/28/2015 10:54 AM   Modules accepted: Kipp Brood

## 2015-08-28 NOTE — Assessment & Plan Note (Signed)
Wet prep sent. Having trouble with twice weekly metrogel.

## 2015-08-29 LAB — WET PREP BY MOLECULAR PROBE
CANDIDA SPECIES: NEGATIVE
Gardnerella vaginalis: POSITIVE — AB
TRICHOMONAS VAG: NEGATIVE

## 2015-08-31 LAB — URINE CULTURE: Colony Count: >=100000

## 2015-09-02 ENCOUNTER — Other Ambulatory Visit: Payer: Self-pay | Admitting: Family Medicine

## 2015-09-02 MED ORDER — METRONIDAZOLE 500 MG PO TABS: 500.0000 mg | ORAL_TABLET | Freq: Two times a day (BID) | ORAL | Status: DC

## 2015-09-05 ENCOUNTER — Encounter: Payer: Self-pay | Admitting: Psychiatry

## 2015-09-05 ENCOUNTER — Ambulatory Visit (INDEPENDENT_AMBULATORY_CARE_PROVIDER_SITE_OTHER): Payer: 59 | Admitting: Psychiatry

## 2015-09-05 VITALS — BP 124/76 | HR 100 | Temp 97.5°F | Ht 62.0 in | Wt 172.0 lb

## 2015-09-05 DIAGNOSIS — F3132 Bipolar disorder, current episode depressed, moderate: Secondary | ICD-10-CM

## 2015-09-05 MED ORDER — HYDROXYZINE PAMOATE 50 MG PO CAPS: ORAL_CAPSULE | ORAL | Status: DC

## 2015-09-05 MED ORDER — LAMOTRIGINE 100 MG PO TABS: 150.0000 mg | ORAL_TABLET | Freq: Every day | ORAL | Status: DC

## 2015-09-05 NOTE — Progress Notes (Signed)
BH MD/PA/NP OP Progress Note  09/05/2015 10:45 AM Allison Schmitt  MRN:  [id number]  Subjective:  Allison Schmitt returns for follow-up of her bipolar disorder type II. Last visit we stopped Wellbutrin because patient stated it made her itch. We increased her Lamictal up to 150 mg daily and she reports her mood is better. She states that she is able to enjoy things although she does state she'll stay in her house and does not go out much. She continues to work part time. She stated that over the past couple of nights she's had difficulty sleeping. She stated there is no anxiety issues that are keeping her up. We reviewed sleep hygiene and she indicates she doesn't nap every day. She also stated she drinks tea but states that it's decaffeinated tea. She also has a CPAP machine which she uses. She states this is not every night she has difficulty sleeping but it does occur occasionally. Chief Complaint:  I think I'm doing a little better Chief Complaint    Follow-up; Medication Refill; Insomnia     Visit Diagnosis:     ICD-9-CM ICD-10-CM   1. Bipolar affective disorder, currently depressed, moderate (HCC) 296.52 F31.32     Past Medical History:  Past Medical History  Diagnosis Date  . Arthritis     bilateral knees  . Sciatica     per patient  . History of chicken pox   . Bipolar disorder (HCC)   . Type 2 diabetes mellitus (HCC) 2006    Diet controlled currently, prior on metformin  . Headache(784.0)   . HTN (hypertension)   . HLD (hyperlipidemia)   . History of kidney stones 2007    s/p lithotripsy, 2 currently, stable, has seen uro in past  . Migraines     prior on maxalt  . History of rheumatic fever     with residual murmur  . Vitamin D deficiency   . OSA (obstructive sleep apnea)     hx this, was on CPAP  . Asthma   . Anxiety   . Depression   . Heart murmur     Past Surgical History  Procedure Laterality Date  . Kidney stone surgery  1989    lithotripsy  . Tubal ligation  1997   . Knee surgery  2009    Right  . Uterine ablation  2010    dysmenorrhea and menorrhagia   Family History:  Family History  Problem Relation Age of Onset  . Bipolar disorder Mother   . Diabetes Mother   . Hyperlipidemia Mother   . Hypertension Mother   . Glaucoma Mother   . Stroke Mother   . Coronary artery disease Mother   . Rheum arthritis Mother   . Anxiety disorder Mother   . Depression Mother   . Glaucoma Father   . Thyroid disease Father   . Cancer Paternal Aunt     pancreatic  . Cancer Paternal Grandfather     bone  . Aneurysm Sister 46    brain, deceased  . Drug abuse Sister   . Bipolar disorder Sister   . Depression Sister   . Depression Sister   . Anxiety disorder Sister   . Diabetes Sister    Social History:  Social History   Social History  . Marital Status: Single    Spouse Name: N/A  . Number of Children: N/A  . Years of Education: N/A   Social History Main Topics  . Smoking status: Current Some Day  Smoker    Types: Cigars    Start date: 05/09/1995  . Smokeless tobacco: Never Used     Comment: 1 cigar 3-4 x/week off and on for 20 years  . Alcohol Use: 3.0 - 4.8 oz/week    4-6 Glasses of wine, 1-2 Cans of beer, 0 Shots of liquor, 0 Standard drinks or equivalent per week     Comment: occasional  . Drug Use: No  . Sexual Activity:    Partners: Male    Birth Control/ Protection: Condom     Comment: uterine ablation/tn   Other Topics Concern  . None   Social History Narrative   Caffeine: dark sodas occasionally   Lives with daughter, 1 dog   Occupation: disability from bipolar, looking for part time job   Edu: HS   Activity: walking - not recently, some cardio at home   Diet: good water, occasional fruits/vegetables   Additional History:   Assessment:   Musculoskeletal: Strength & Muscle Tone: within normal limits Gait & Station: normal Patient leans: N/A  Psychiatric Specialty Exam: Insomnia PMH includes: no depression.     Review of Systems  Psychiatric/Behavioral: Negative for depression, suicidal ideas, hallucinations, memory loss and substance abuse. The patient has insomnia. The patient is not nervous/anxious.   All other systems reviewed and are negative.   Blood pressure 124/76, pulse 100, temperature 97.5 F (36.4 C), temperature source Tympanic, height 5\' 2"  (1.575 m), weight 172 lb (78.019 kg), last menstrual period 09/05/2015, SpO2 95 %.Body mass index is 31.45 kg/(m^2).  General Appearance: Well Groomed  Eye Contact:  Good  Speech:  Normal Rate  Volume:  Normal  Mood:  I think I'm a little better  Affect:  brighter  Thought Process:  Linear  Orientation:  Full (Time, Place, and Person)  Thought Content:  Negative  Suicidal Thoughts:  No  Homicidal Thoughts:  No  Memory:  Immediate;   Good Recent;   Good Remote;   Good  Judgement:  Good  Insight:  Good  Psychomotor Activity:  Negative  Concentration:  Good  Recall:  Good  Fund of Knowledge: Good  Language: Good  Akathisia:  Negative  Handed:  Right  AIMS (if indicated):  N/A  Assets:  Communication Skills Desire for Improvement Vocational/Educational  ADL's:  Intact  Cognition: WNL  Sleep:  good   Is the patient at risk to self?  No. Has the patient been a risk to self in the past 6 months?  No. Has the patient been a risk to self within the distant past?  Yes.   Is the patient a risk to others?  No. Has the patient been a risk to others in the past 6 months?  No. Has the patient been a risk to others within the distant past?  No.  Current Medications: Current Outpatient Prescriptions  Medication Sig Dispense Refill  . ACCU-CHEK SOFTCLIX LANCETS lancets Use every other day 100 each prn  . albuterol (PROAIR HFA) 108 (90 BASE) MCG/ACT inhaler Inhale 2 puffs into the lungs every 6 (six) hours as needed. 18 g 3  . citalopram (CELEXA) 40 MG tablet TAKE ONE TABLET BY MOUTH ONE TIME DAILY 90 tablet 2  . Fluticasone-Salmeterol  (ADVAIR DISKUS) 250-50 MCG/DOSE AEPB Inhale into the lungs.    Marland Kitchen glucose blood test strip Use every other day - Accu-Chek 50 each 6  . lamoTRIgine (LAMICTAL) 100 MG tablet Take 1.5 tablets (150 mg total) by mouth daily. 45 tablet 3  .  lisinopril (PRINIVIL,ZESTRIL) 40 MG tablet TAKE ONE TABLET BY MOUTH ONE TIME DAILY 90 tablet 2  . metroNIDAZOLE (FLAGYL) 500 MG tablet Take 1 tablet (500 mg total) by mouth 2 (two) times daily. 14 tablet 0  . sulfamethoxazole-trimethoprim (BACTRIM DS,SEPTRA DS) 800-160 MG tablet Take 1 tablet by mouth 2 (two) times daily. 20 tablet 0  . hydrOXYzine (VISTARIL) 50 MG capsule Take one to two capsules as needed at bedtime for sleep. 60 capsule 3   No current facility-administered medications for this visit.    Medical Decision Making:  Established Problem, Stable/Improving (1), Review of Medication Regimen & Side Effects (2) and Review of New Medication or Change in Dosage (2)  Treatment Plan Summary:Medication management and Plan  Bipolar disorder type II-patient reports proven in her depressed state. Continue Lamictal 150 mg. She will continue citalopram 40 mg daily. She still has a prescription from her primary care doctor that should last her through May 2016.  Insomnia-patient would prefer not to take any sleep medication. In addition given her sleep apnea certain sleep medication should be avoided. However we did discuss Vistaril to have as an as needed medication and she is agreeable to this. We will prescribe Vistaril 50-100 mg at bedtime as needed. Risk and benefits have been discussed and patient is able to consent.   She'll follow up in 2 month. She is aware she'll see another psychiatrist in this practice at her next visit. She's been encouraged call any questions or concerns prior to her next appointment.  Wallace Going 09/05/2015, 10:45 AM

## 2015-09-21 DIAGNOSIS — G4733 Obstructive sleep apnea (adult) (pediatric): Secondary | ICD-10-CM | POA: Diagnosis not present

## 2015-10-19 ENCOUNTER — Encounter: Payer: Self-pay | Admitting: Internal Medicine

## 2015-10-19 DIAGNOSIS — G4733 Obstructive sleep apnea (adult) (pediatric): Secondary | ICD-10-CM | POA: Diagnosis not present

## 2015-10-21 ENCOUNTER — Ambulatory Visit (INDEPENDENT_AMBULATORY_CARE_PROVIDER_SITE_OTHER): Payer: Medicare Other | Admitting: Internal Medicine

## 2015-10-21 ENCOUNTER — Encounter: Payer: Self-pay | Admitting: Internal Medicine

## 2015-10-21 VITALS — BP 126/86 | HR 80 | Ht 62.0 in | Wt 170.2 lb

## 2015-10-21 DIAGNOSIS — G4733 Obstructive sleep apnea (adult) (pediatric): Secondary | ICD-10-CM

## 2015-10-21 NOTE — Progress Notes (Signed)
[facility]     Assessment and Plan:  OSA.  -Repeat sleep study showed an AHI of 58, controlled with a CPAP of 11. Encouraged her to increase her CPAP use. She was reassured today that I have reviewed the machine setting and her residual apnea readings, and it appears that the CPAP setting is appropriate.   Snoring.  -Loud snoring with witnessed apneas in the past, likely due to  sleep apnea.  Depression-Bipolar -With excessive daytime sleepiness, sleep apnea may be contributing.   Date: 10/21/2015  MRN# [id number] Allison Schmitt 1970/12/09   Allison Schmitt is a 45 y.o. old female seen in follow up for chief complaint of excessive daytime sleepiness.    HPI:  The patient is a 45 year old female with a history of obstructive sleep apnea, diagnosed around 2007. She had seen [place] in October 2016, at that time it was noted that she was excessively sleepy, she had not been on CPAP for several years. She was subsequent sent for a titration study, and sleep study, the results of which are reviewed below. AHI of 58, CPAP recommended at 9.  She notes that she is no longer snoring, she notes that she feels more rested in the morning on the nights that she slept with it.   Review of sleep titration study performed on 07/25/2015: Patient had a previous sleep study on 07/03/2015 showing an apnea-hypopnea index of 58. Patient was recommended to be on a CPAP of 9.  Review of her download data shows that the patient is on a CPAP of 9, residual apnea. His 3 on average, she spent only 12 of 21 days with greater than 4 hours of usage.  Medication:   Outpatient Encounter Prescriptions as of 10/21/2015  Medication Sig  . ACCU-CHEK SOFTCLIX LANCETS lancets Use every other day  . albuterol (PROAIR HFA) 108 (90 BASE) MCG/ACT inhaler Inhale 2 puffs into the lungs every 6 (six) hours as needed.  . citalopram (CELEXA) 40 MG tablet TAKE ONE TABLET BY MOUTH ONE TIME DAILY  .  Fluticasone-Salmeterol (ADVAIR DISKUS) 250-50 MCG/DOSE AEPB Inhale into the lungs.  Marland Kitchen glucose blood test strip Use every other day - Accu-Chek  . hydrOXYzine (VISTARIL) 50 MG capsule Take one to two capsules as needed at bedtime for sleep.  Marland Kitchen lamoTRIgine (LAMICTAL) 100 MG tablet Take 1.5 tablets (150 mg total) by mouth daily.  Marland Kitchen lisinopril (PRINIVIL,ZESTRIL) 40 MG tablet TAKE ONE TABLET BY MOUTH ONE TIME DAILY  . metroNIDAZOLE (FLAGYL) 500 MG tablet Take 1 tablet (500 mg total) by mouth 2 (two) times daily.  Marland Kitchen sulfamethoxazole-trimethoprim (BACTRIM DS,SEPTRA DS) 800-160 MG tablet Take 1 tablet by mouth 2 (two) times daily.   No facility-administered encounter medications on file as of 10/21/2015.     Allergies:  Ciprofloxacin and Penicillins  Review of Systems: Gen:  Denies  fever, sweats. HEENT: Denies blurred vision. Cvc:  No dizziness, chest pain or heaviness Resp:   Denies cough or sputum porduction. Gi: Denies swallowing difficulty, stomach pain. constipation, bowel incontinence Gu:  Denies bladder incontinence, burning urine Ext:   No Joint pain, stiffness. Skin: No skin rash, easy bruising. Endoc:  No polyuria, polydipsia. Psych: No depression, insomnia. Other:  All other systems were reviewed and found to be negative other than what is mentioned in the HPI.   Physical Examination:   VS: BP 126/86 mmHg  Pulse 80  Ht  (1.575 m)  Wt 170 lb 3.2 oz (77.202 kg)  BMI 31.12 kg/m2  SpO2 96%  General Appearance: No distress  Neuro:without focal findings,  speech normal,  HEENT: PERRLA, EOM intact. Pulmonary: normal breath sounds, No wheezing.   CardiovascularNormal S1,S2.  No m/r/g.   Abdomen: Benign, Soft, non-tender. Renal:  No costovertebral tenderness  GU:  Not performed at this time. Endoc: No evident thyromegaly, no signs of acromegaly. Skin:   warm, no rash. Extremities: normal, no cyanosis, clubbing.   LABORATORY PANEL:   CBC No results for input(s): WBC,  HGB, HCT, PLT in the last 168 hours. ------------------------------------------------------------------------------------------------------------------  Chemistries  No results for input(s): NA, K, CL, CO2, GLUCOSE, BUN, CREATININE, CALCIUM, MG, AST, ALT, ALKPHOS, BILITOT in the last 168 hours.  Invalid input(s): GFRCGP ------------------------------------------------------------------------------------------------------------------  Cardiac Enzymes No results for input(s): TROPONINI in the last 168 hours. ------------------------------------------------------------  RADIOLOGY:   No results found for this or any previous visit. Results for orders placed during the hospital encounter of 11/09/13  DG Chest 2 View   Narrative CLINICAL DATA:  Cough for 1 month, smoker  EXAM: CHEST  2 VIEW  COMPARISON:  None.  FINDINGS: Cardiomediastinal silhouette is unremarkable. No acute infiltrate or pleural effusion. No pulmonary edema bony thorax is unremarkable.  IMPRESSION: No active cardiopulmonary disease.   Electronically Signed   By: Natasha MeadLiviu  Pop M.D.   On: 11/09/2013 13:08    ------------------------------------------------------------------------------------------------------------------  Thank  you for allowing [facility] to assist in the care of your patient. Our recommendations are noted above.  Please contact us if we can be of further service.   Wells Guileseep Geovani Tootle, MD.  [facility]  Santiago Gladavid Kasa, M.D.  Stephanie AcreVishal Mungal, M.D.  Billy Fischeravid Simonds, M.D

## 2015-10-21 NOTE — Patient Instructions (Signed)
Rinse tubing and mask once per week.    Sleep Apnea Sleep apnea is disorder that affects a person's sleep. A person with sleep apnea has abnormal pauses in their breathing when they sleep. It is hard for them to get a good sleep. This makes a person tired during the day. It also can lead to other physical problems. There are three types of sleep apnea. One type is when breathing stops for a short time because your airway is blocked (obstructive sleep apnea). Another type is when the brain sometimes fails to give the normal signal to breathe to the muscles that control your breathing (central sleep apnea). The third type is a combination of the other two types. HOME CARE   Take all medicine as told by your doctor.  Avoid alcohol, calming medicines (sedatives), and depressant drugs.  Try to lose weight if you are overweight. Talk to your doctor about a healthy weight goal.  Your doctor may have you use a device that helps to open your airway. It can help you get the air that you need. It is called a positive airway pressure (PAP) device.   MAKE SURE YOU:   Understand these instructions.  Will watch your condition.  Will get help right away if you are not doing well or get worse.  It may take approximately 1 month for you to get used to wearing her CPAP every night.

## 2015-10-22 ENCOUNTER — Encounter: Payer: Self-pay | Admitting: Family Medicine

## 2015-10-22 ENCOUNTER — Ambulatory Visit (INDEPENDENT_AMBULATORY_CARE_PROVIDER_SITE_OTHER): Payer: Medicare Other | Admitting: Family Medicine

## 2015-10-22 VITALS — BP 114/70 | HR 71 | Temp 98.8°F | Ht 62.0 in | Wt 169.8 lb

## 2015-10-22 DIAGNOSIS — M545 Low back pain, unspecified: Secondary | ICD-10-CM | POA: Insufficient documentation

## 2015-10-22 DIAGNOSIS — M542 Cervicalgia: Secondary | ICD-10-CM | POA: Diagnosis not present

## 2015-10-22 LAB — POC URINALSYSI DIPSTICK (AUTOMATED)
BILIRUBIN UA: NEGATIVE
Leukocytes, UA: NEGATIVE
Nitrite, UA: NEGATIVE
PH UA: 6
PROTEIN UA: NEGATIVE
RBC UA: NEGATIVE
SPEC GRAV UA: 1.015
Urobilinogen, UA: NEGATIVE

## 2015-10-22 MED ORDER — CYCLOBENZAPRINE HCL 10 MG PO TABS: 10.0000 mg | ORAL_TABLET | Freq: Every evening | ORAL | Status: AC | PRN

## 2015-10-22 MED ORDER — DICLOFENAC SODIUM 75 MG PO TBEC: 75.0000 mg | DELAYED_RELEASE_TABLET | Freq: Two times a day (BID) | ORAL | Status: DC

## 2015-10-22 NOTE — Progress Notes (Signed)
   Subjective:    Patient ID: Allison Schmitt, female    DOB: August 21, 1970, 45 y.o.   MRN: [id number]  HPI  45 year old female with diabeteds presents for new onset back, neck and leg pain in last week.  No fall or injury known.  Pain is in low back, pain increases with bending over. 10/10 on pain scale.  Worse after lying down, standing is better.  Radiates down both legs.  Tingling and numbness in both legs.  No weakness. Pain has now started moved back up to her neck. No current low back pain  She has been using flexeril. Helps with pain.  She cuts hair and stands at work.  UA clear but a lot of sugar in urine.  She has history of kidney infection.. In November.  Social History /Family History/Past Medical History reviewed and updated if needed. Hx of DDD in neck and sciatica in low back in past.     Review of Systems  Constitutional: Negative for fatigue.  Eyes: Negative for pain.  Respiratory: Negative for cough.   Cardiovascular: Negative for chest pain.       Objective:   Physical Exam  Constitutional: Vital signs are normal. She appears well-developed and well-nourished. She is cooperative.  Non-toxic appearance. She does not appear ill. No distress.  HENT:  Head: Normocephalic.  Right Ear: Hearing, tympanic membrane, external ear and ear canal normal. Tympanic membrane is not erythematous, not retracted and not bulging.  Left Ear: Hearing, tympanic membrane, external ear and ear canal normal. Tympanic membrane is not erythematous, not retracted and not bulging.  Nose: No mucosal edema or rhinorrhea. Right sinus exhibits no maxillary sinus tenderness and no frontal sinus tenderness. Left sinus exhibits no maxillary sinus tenderness and no frontal sinus tenderness.  Mouth/Throat: Uvula is midline, oropharynx is clear and moist and mucous membranes are normal.  Eyes: Conjunctivae, EOM and lids are normal. Pupils are equal, round, and reactive to light. Lids are everted  and swept, no foreign bodies found.  Neck: Trachea normal and normal range of motion. Neck supple. Carotid bruit is not present. No thyroid mass and no thyromegaly present.  Cardiovascular: Normal rate, regular rhythm, S1 normal, S2 normal, normal heart sounds, intact distal pulses and normal pulses.  Exam reveals no gallop and no friction rub.   No murmur heard. Pulmonary/Chest: Effort normal and breath sounds normal. No tachypnea. No respiratory distress. She has no decreased breath sounds. She has no wheezes. She has no rhonchi. She has no rales.  Abdominal: Soft. Normal appearance and bowel sounds are normal. There is no tenderness.  Musculoskeletal:       Cervical back: She exhibits decreased range of motion and tenderness. She exhibits no bony tenderness and no swelling.       Lumbar back: She exhibits decreased range of motion and tenderness. She exhibits no bony tenderness.  Neg spurling, diffusely ttp over trapezius bilaterally  Positive SLR bilaterally, R>L, neg faber's bilaterally  Neurological: She is alert.  Skin: Skin is warm, dry and intact. No rash noted.  Psychiatric: Her speech is normal and behavior is normal. Judgment and thought content normal. Her mood appears not anxious. Cognition and memory are normal. She does not exhibit a depressed mood.          Assessment & Plan:

## 2015-10-22 NOTE — Assessment & Plan Note (Signed)
Treat with heat, massage, NSAIDs and muscle relaxant.

## 2015-10-22 NOTE — Progress Notes (Signed)
Pre visit review using our clinic review tool, if applicable. No additional management support is needed unless otherwise documented below in the visit note. 

## 2015-10-22 NOTE — Patient Instructions (Addendum)
Decrease sugar, sweets  and bread in diet given sugar seen in urine. Start heat, massage start gentle stretching.  Start diclofenac twice daily, use flexeril at night. Follow up with Dr. Reece AgarG in 2 weeks if not better.

## 2015-10-22 NOTE — Assessment & Plan Note (Signed)
Pt with bilateral radiation of pain, bilateral SLR. ? Spinal stenosis.  Treat with heat, massage, NSAIDs and muscle relaxant.  Consider X-ray and formal PT if not improving as expected in 2 weeks.

## 2015-10-28 ENCOUNTER — Ambulatory Visit: Payer: 59 | Admitting: Psychiatry

## 2015-11-01 ENCOUNTER — Ambulatory Visit (INDEPENDENT_AMBULATORY_CARE_PROVIDER_SITE_OTHER): Payer: 59 | Admitting: Psychiatry

## 2015-11-01 ENCOUNTER — Encounter: Payer: Self-pay | Admitting: Psychiatry

## 2015-11-01 VITALS — BP 110/78 | HR 100 | Temp 98.0°F | Ht 62.0 in | Wt 164.0 lb

## 2015-11-01 DIAGNOSIS — F3132 Bipolar disorder, current episode depressed, moderate: Secondary | ICD-10-CM

## 2015-11-01 MED ORDER — CITALOPRAM HYDROBROMIDE 40 MG PO TABS: 40.0000 mg | ORAL_TABLET | Freq: Every day | ORAL | Status: AC

## 2015-11-01 MED ORDER — ALPRAZOLAM 0.25 MG PO TABS: 0.2500 mg | ORAL_TABLET | Freq: Every evening | ORAL | Status: AC | PRN

## 2015-11-01 MED ORDER — LAMOTRIGINE 200 MG PO TABS: 200.0000 mg | ORAL_TABLET | Freq: Every day | ORAL | Status: AC

## 2015-11-01 NOTE — Progress Notes (Signed)
BH MD/PA/NP OP Progress Note  11/01/2015 1:34 PM Allison Schmitt  MRN:  [id number]  Subjective: Patient is a 45 year old female who presented for the follow-up appointment. She was following Dr. Mayford Knife in the outpatient clinic. She missed her appointment. She reported that she was feeling anxious this morning and she came for the appointment. She reported that she has been feeling depressed and has been having difficulty sleeping at night. She was asking for a prescription of Xanax as she has taken in the past. She reported that she takes lamotrigine and Celexa in the morning. She is planning to relocate to [place] to be close to her son and grandson. She is currently working in the [employer] and reported that she will be looking for another  job closer to that area. She currently denied having any suicidal ideations or plans. She is currently compliant with her medications. She appeared calm and cooperative during the interview.  Chief Complaint:  I think I'm doing a little better Chief Complaint    Medication Refill; Follow-up     Visit Diagnosis:     ICD-9-CM ICD-10-CM   1. Bipolar affective disorder, currently depressed, moderate (HCC) 296.52 F31.32     Past Medical History:  Past Medical History  Diagnosis Date  . Arthritis     bilateral knees  . Sciatica     per patient  . History of chicken pox   . Bipolar disorder (HCC)   . Type 2 diabetes mellitus (HCC) 2006    Diet controlled currently, prior on metformin  . Headache(784.0)   . HTN (hypertension)   . HLD (hyperlipidemia)   . History of kidney stones 2007    s/p lithotripsy, 2 currently, stable, has seen uro in past  . Migraines     prior on maxalt  . History of rheumatic fever     with residual murmur  . Vitamin D deficiency   . OSA (obstructive sleep apnea)     hx this, was on CPAP  . Asthma   . Anxiety   . Depression   . Heart murmur     Past Surgical History  Procedure Laterality Date  . Kidney stone  surgery  1989    lithotripsy  . Tubal ligation  1997  . Knee surgery  2009    Right  . Uterine ablation  2010    dysmenorrhea and menorrhagia   Family History:  Family History  Problem Relation Age of Onset  . Bipolar disorder Mother   . Diabetes Mother   . Hyperlipidemia Mother   . Hypertension Mother   . Glaucoma Mother   . Stroke Mother   . Coronary artery disease Mother   . Rheum arthritis Mother   . Anxiety disorder Mother   . Depression Mother   . Glaucoma Father   . Thyroid disease Father   . Cancer Paternal Aunt     pancreatic  . Cancer Paternal Grandfather     bone  . Aneurysm Sister 46    brain, deceased  . Drug abuse Sister   . Bipolar disorder Sister   . Depression Sister   . Depression Sister   . Anxiety disorder Sister   . Diabetes Sister    Social History:  Social History   Social History  . Marital Status: Single    Spouse Name: N/A  . Number of Children: N/A  . Years of Education: N/A   Social History Main Topics  . Smoking status: Current Some Day  Smoker    Types: Cigars    Start date: 05/09/1995  . Smokeless tobacco: Never Used     Comment: 1 cigar 3-4 x/week off and on for 20 years  . Alcohol Use: 3.0 - 4.8 oz/week    4-6 Glasses of wine, 1-2 Cans of beer, 0 Shots of liquor, 0 Standard drinks or equivalent per week     Comment: occasional  . Drug Use: No  . Sexual Activity:    Partners: Male    Birth Control/ Protection: Condom     Comment: uterine ablation/tn   Other Topics Concern  . None   Social History Narrative   Caffeine: dark sodas occasionally   Lives with daughter, 1 dog   Occupation: disability from bipolar, looking for part time job   Edu: HS   Activity: walking - not recently, some cardio at home   Diet: good water, occasional fruits/vegetables   Additional History:   Assessment:   Musculoskeletal: Strength & Muscle Tone: within normal limits Gait & Station: normal Patient leans: N/A  Psychiatric  Specialty Exam: Insomnia PMH includes: no depression.    Review of Systems  Psychiatric/Behavioral: Negative for depression, suicidal ideas, hallucinations, memory loss and substance abuse. The patient has insomnia. The patient is not nervous/anxious.   All other systems reviewed and are negative.   Blood pressure 110/78, pulse 100, temperature 98 F (36.7 C), temperature source Tympanic, height 5\' 2"  (1.575 m), weight 164 lb (74.39 kg), last menstrual period 10/13/2015, SpO2 93 %.Body mass index is 29.99 kg/(m^2).  General Appearance: Casual and Fairly Groomed  Eye Contact:  Good  Speech:  Normal Rate  Volume:  Normal  Mood:  Anxious  Affect:  Congruent  Thought Process:  Linear  Orientation:  Full (Time, Place, and Person)  Thought Content:  Negative  Suicidal Thoughts:  No  Homicidal Thoughts:  No  Memory:  Immediate;   Good Recent;   Good Remote;   Good  Judgement:  Good  Insight:  Good  Psychomotor Activity:  Negative  Concentration:  Good  Recall:  Good  Fund of Knowledge: Good  Language: Good  Akathisia:  Negative  Handed:  Right  AIMS (if indicated):  N/A  Assets:  Communication Skills Desire for Improvement Vocational/Educational  ADL's:  Intact  Cognition: WNL  Sleep:  good   Is the patient at risk to self?  No. Has the patient been a risk to self in the past 6 months?  No. Has the patient been a risk to self within the distant past?  Yes.   Is the patient a risk to others?  No. Has the patient been a risk to others in the past 6 months?  No. Has the patient been a risk to others within the distant past?  No.  Current Medications: Current Outpatient Prescriptions  Medication Sig Dispense Refill  . ACCU-CHEK SOFTCLIX LANCETS lancets Use every other day 100 each prn  . albuterol (PROAIR HFA) 108 (90 BASE) MCG/ACT inhaler Inhale 2 puffs into the lungs every 6 (six) hours as needed. 18 g 3  . ALPRAZolam (XANAX) 0.25 MG tablet Take 1 tablet (0.25 mg total) by  mouth at bedtime as needed for anxiety. 30 tablet 0  . citalopram (CELEXA) 40 MG tablet Take 1 tablet (40 mg total) by mouth daily. 90 tablet 2  . cyclobenzaprine (FLEXERIL) 10 MG tablet Take 1 tablet (10 mg total) by mouth at bedtime as needed for muscle spasms. 30 tablet 0  . diclofenac (  VOLTAREN) 75 MG EC tablet Take 1 tablet (75 mg total) by mouth 2 (two) times daily. 30 tablet 0  . Fluticasone-Salmeterol (ADVAIR DISKUS) 250-50 MCG/DOSE AEPB Inhale into the lungs.    Marland Kitchen glucose blood test strip Use every other day - Accu-Chek 50 each 6  . lamoTRIgine (LAMICTAL) 200 MG tablet Take 1 tablet (200 mg total) by mouth daily. 30 tablet 1  . lisinopril (PRINIVIL,ZESTRIL) 40 MG tablet TAKE ONE TABLET BY MOUTH ONE TIME DAILY 90 tablet 2   No current facility-administered medications for this visit.    Medical Decision Making:  Established Problem, Stable/Improving (1), Review of Medication Regimen & Side Effects (2) and Review of New Medication or Change in Dosage (2)  Treatment Plan Summary:Medication management and Plan   Discussed with patient about the medications. She will continue on Celexa 40 mg at at bedtime. She will be started on lamotrigine 200 mg in the morning. I will also start her on Xanax 0.25 mg at bedtime to help with her anxiety.. She demonstrated understanding.   Follow-up in 1 month Brandy Hale, MD   11/01/2015, 1:34 PM

## 2015-11-04 ENCOUNTER — Telehealth: Payer: Self-pay | Admitting: Family Medicine

## 2015-11-04 NOTE — Telephone Encounter (Signed)
Pt has appt on 11/05/15 at 9 AM with Dr Ermalene SearingBedsole.

## 2015-11-04 NOTE — Telephone Encounter (Signed)
Patient Name: Allison Schmitt  DOB: 1970-10-03    Initial Comment Caller states, blood sugar is higher than normal, 353, feels sluggish, has been taking a new Rx since Nov or Dec. Her last office visit, 2-3 weeks ago, had sugar in her urine.    Nurse Assessment  Nurse: Stefano GaulStringer, RN, Dwana CurdVera Date/Time (Eastern Time): 11/04/2015 11:45:56 AM  Confirm and document reason for call. If symptomatic, describe symptoms. You must click the next button to save text entered. ---Caller states her blood sugar is high. Her blood sugar was 353 this am. Had to get some batteries for her glucometer. She started Lamotrigine in Dec. Had UA on march 28 which showed sugar is urine. Does not take diabetes medication. She has been feeling tired and "sluggish". First time she checked her blood sugar in a while as her glucometer needed batteries.  Has the patient traveled out of the country within the last 30 days? ---Not Applicable  Does the patient have any new or worsening symptoms? ---Yes  Will a triage be completed? ---Yes  Related visit to physician within the last 2 weeks? ---No  Does the PT have any chronic conditions? (i.e. diabetes, asthma, etc.) ---Yes  List chronic conditions. ---asthma; bipolar disease; HTN, diabetes  Is the patient pregnant or possibly pregnant? (Ask all females between the ages of 5712-55) ---No  Is this a behavioral health or substance abuse call? ---No     Guidelines    Guideline Title Affirmed Question Affirmed Notes  Diabetes - High Blood Sugar [1] Blood glucose > 240 mg/dl (13 mmol/l) AND [8][2] does not use insulin (e.g., not insulin-dependent; most type 2 diabetics) (all triage questions negative)    Final Disposition User   See Physician within 24 Hours TowandaStringer, RN, Dwana CurdVera    Comments  appt scheduled for 11/05/15 at 9 am [facility]  Triage outcome upgraded to see physician within 24 hrs as blood sugar was 353 this and pt has been tired and does not take  medication for diabetes.   Referrals  REFERRED TO PCP OFFICE   Disagree/Comply: Comply

## 2015-11-05 ENCOUNTER — Ambulatory Visit (INDEPENDENT_AMBULATORY_CARE_PROVIDER_SITE_OTHER): Payer: Medicare Other | Admitting: Family Medicine

## 2015-11-05 ENCOUNTER — Encounter: Payer: Self-pay | Admitting: Family Medicine

## 2015-11-05 VITALS — BP 102/64 | HR 94 | Temp 98.4°F | Ht 62.0 in | Wt 163.0 lb

## 2015-11-05 DIAGNOSIS — R739 Hyperglycemia, unspecified: Secondary | ICD-10-CM

## 2015-11-05 DIAGNOSIS — R7309 Other abnormal glucose: Secondary | ICD-10-CM | POA: Diagnosis not present

## 2015-11-05 DIAGNOSIS — E119 Type 2 diabetes mellitus without complications: Secondary | ICD-10-CM

## 2015-11-05 LAB — GLUCOSE, POCT (MANUAL RESULT ENTRY): POC GLUCOSE: 319 mg/dL — AB (ref 70–99)

## 2015-11-05 LAB — HEMOGLOBIN A1C: Hgb A1c MFr Bld: 11.5 % — ABNORMAL HIGH (ref 4.6–6.5)

## 2015-11-05 MED ORDER — METFORMIN HCL ER 500 MG PO TB24: 500.0000 mg | ORAL_TABLET | Freq: Every day | ORAL | Status: DC

## 2015-11-05 NOTE — Progress Notes (Signed)
Pre visit review using our clinic review tool, if applicable. No additional management support is needed unless otherwise documented below in the visit note. 

## 2015-11-05 NOTE — Patient Instructions (Addendum)
Hold lamictal.  Start metformin once daily. Check CBGs daily fasting and  2 hours after one meal each day. Record measurements.  Working on low carb diet. Increase water, avoid soda and juice, sweet tea.  Stop at lab on way out. Keep appt with PCP in 11/2015.

## 2015-11-05 NOTE — Assessment & Plan Note (Signed)
No clear SE of Lamictal causing hyperglycemia, but pt does have history of severe elevation of glucose with Depakote. No other clear cause of increase in sugar other than possible natural progression of DM given family history.  Pt started on metformin, reviewed diet ( refused nutritionist), increase ater, follow with glucometer.  Will check A1C today. Follow up as scheduled in 1 month with PCP.

## 2015-11-05 NOTE — Progress Notes (Signed)
   Subjective:    Patient ID: Allison Schmitt, female    DOB: [birth year], 45 y.o.   MRN: [id number]  HPI   45 year old female pt of Dr. Reece AgarG with history of diet controlled DM  presents with recently increased glucose measurements.  She reports that in last 2 days when she started checking at home glucose was fasting 353 312 postpranprandially   This AM in office fasting CBG 312. At last OV here she had glucose in urine. Lab Results  Component Value Date   HGBA1C 6.7* 10/25/2014   Body mass index is 29.81 kg/(m^2).   Recently she has increased her dose of lamictal.  No recent infection symptoms. No recent steroid injections.  No recent diet changes.. she has noted she wants sweets more, but trying to avoid.  Social History /Family History/Past Medical History reviewed and updated if needed. DM in mother, sister and aunts.  personal history of severe DM years asgo after starting depakote.. CBG 1127! Was temporarily on insulin.  Review of Systems  Constitutional: Positive for fatigue. Negative for fever.       Thirsty  HENT: Negative for ear pain.   Eyes: Negative for pain.  Respiratory: Negative for chest tightness and shortness of breath.   Cardiovascular: Negative for chest pain, palpitations and leg swelling.  Gastrointestinal: Negative for abdominal pain.  Genitourinary: Negative for dysuria.       Objective:   Physical Exam  Constitutional: Vital signs are normal. She appears well-developed and well-nourished. She is cooperative.  Non-toxic appearance. She does not appear ill. No distress.  HENT:  Head: Normocephalic.  Right Ear: Hearing, tympanic membrane, external ear and ear canal normal. Tympanic membrane is not erythematous, not retracted and not bulging.  Left Ear: Hearing, tympanic membrane, external ear and ear canal normal. Tympanic membrane is not erythematous, not retracted and not bulging.  Nose: No mucosal edema or rhinorrhea. Right sinus exhibits no  maxillary sinus tenderness and no frontal sinus tenderness. Left sinus exhibits no maxillary sinus tenderness and no frontal sinus tenderness.  Mouth/Throat: Uvula is midline, oropharynx is clear and moist and mucous membranes are normal.  Eyes: Conjunctivae, EOM and lids are normal. Pupils are equal, round, and reactive to light. Lids are everted and swept, no foreign bodies found.  Neck: Trachea normal and normal range of motion. Neck supple. Carotid bruit is not present. No thyroid mass and no thyromegaly present.  Cardiovascular: Normal rate, regular rhythm, S1 normal, S2 normal, normal heart sounds, intact distal pulses and normal pulses.  Exam reveals no gallop and no friction rub.   No murmur heard. Pulmonary/Chest: Effort normal and breath sounds normal. No tachypnea. No respiratory distress. She has no decreased breath sounds. She has no wheezes. She has no rhonchi. She has no rales.  Abdominal: Soft. Normal appearance and bowel sounds are normal. There is no tenderness.  Neurological: She is alert.  Skin: Skin is warm, dry and intact. No rash noted.  Psychiatric: Her speech is normal and behavior is normal. Judgment and thought content normal. Her mood appears not anxious. Cognition and memory are normal. She does not exhibit a depressed mood.          Assessment & Plan:

## 2015-11-11 ENCOUNTER — Ambulatory Visit (INDEPENDENT_AMBULATORY_CARE_PROVIDER_SITE_OTHER): Payer: Medicare Other | Admitting: Urology

## 2015-11-11 ENCOUNTER — Encounter: Payer: Self-pay | Admitting: Urology

## 2015-11-11 VITALS — BP 97/66 | HR 87 | Ht 62.0 in | Wt 160.3 lb

## 2015-11-11 DIAGNOSIS — N3000 Acute cystitis without hematuria: Secondary | ICD-10-CM

## 2015-11-11 DIAGNOSIS — N261 Atrophy of kidney (terminal): Secondary | ICD-10-CM | POA: Diagnosis not present

## 2015-11-11 DIAGNOSIS — N390 Urinary tract infection, site not specified: Secondary | ICD-10-CM | POA: Diagnosis not present

## 2015-11-11 LAB — URINALYSIS, COMPLETE
Bilirubin, UA: NEGATIVE
Leukocytes, UA: NEGATIVE
Nitrite, UA: NEGATIVE
PROTEIN UA: NEGATIVE
SPEC GRAV UA: 1.02 (ref 1.005–1.030)
UUROB: 0.2 mg/dL (ref 0.2–1.0)
pH, UA: 5.5 (ref 5.0–7.5)

## 2015-11-11 LAB — MICROSCOPIC EXAMINATION

## 2015-11-11 LAB — BLADDER SCAN AMB NON-IMAGING: SCAN RESULT: 20

## 2015-11-11 NOTE — Progress Notes (Signed)
9:11 AM   Allison Schmitt 01/02/1971 [id number]  Referring provider: Eustaquio Boyden, MD [address] [place], [place] [address]  Chief Complaint  Patient presents with  . Cystitis    3 mth f/u      9:11 AM   Allison Schmitt 02-01-1971 [id number]  Referring provider: Eustaquio Boyden, MD [address] [place], [place] [address]  Chief Complaint  Patient presents with  . Cystitis    3 mth f/u    HPI: Patient is a 45 yo AA female with a history of renal stones presenting today with complaints of recurrent urinary tract infections. She reports 4 documented UTI's in the past 6 months.  Most recent culture on 05/21/15 showing insignificant growth.   Recent recurrent citrobacter koseri UTI with intermediate sensitivity to nitrofurantion - 4 UTI's growing citrobacter in past 6 months - 3 treated with Macrobid, 4th treated with fosfomycin that pt had to pay out of pocket because insurance would not cover. Sxs resolved after treatment. Test of cure UCx 04/22/2015 returned again with 80k citrobacter (without sxs) pointing to colonization.  A urine culture and February 2017 also return Citrobacter.  Regular menstrual cycle.  History of 2 vaginal deliveries. History of recurrent bacterial vaginosis.  She completed her last antibiotic course 2 weeks ago. Her primary provider has referred her for allergy testing    History of 6 stone episodes.  3 ESWL, 1 URS  H2O intake 3-4 16oz bottles of water daily.  Current Status: Today, she states she's had the onset of right-sided abdominal pain.  It has been occurring for the last 3-4 days. She is not had any associated urinary symptoms with the pain.  She specifically denies dysuria and gross hematuria.  She is not had fevers, chills, nausea or vomiting.  She has recently been reinstated on her Glucophage.  Recent hemoglobin A1c is 11.5 which is up from 6.7  1 year ago.  She did undergo a CT scan of the abdomen and pelvis with  contrast in October 2016.  It noted probable mild right pyelonephritis, without evidence of renal abscess, mass, or hydronephrosis. Chronic right renal parenchymal scarring and atrophy also noted. Tiny nonobstructive left renal calculus. No evidence of ureteral calculi or hydronephrosis. Small uterine fibroids.  I have personally reviewed the films.   PMH: Past Medical History  Diagnosis Date  . Arthritis     bilateral knees  . Sciatica     per patient  . History of chicken pox   . Bipolar disorder (HCC)   . Type 2 diabetes mellitus (HCC) 2006    Diet controlled currently, prior on metformin  . Headache(784.0)   . HTN (hypertension)   . HLD (hyperlipidemia)   . History of kidney stones 2007    s/p lithotripsy, 2 currently, stable, has seen uro in past  . Migraines     prior on maxalt  . History of rheumatic fever     with residual murmur  . Vitamin D deficiency   . OSA (obstructive sleep apnea)     hx this, was on CPAP  . Asthma   . Anxiety   . Depression   . Heart murmur     Surgical History: Past Surgical History  Procedure Laterality Date  . Kidney stone surgery  1989    lithotripsy  . Tubal ligation  1997  . Knee surgery  2009    Right  . Uterine ablation  2010    dysmenorrhea and menorrhagia  Home Medications:    Medication List       This list is accurate as of: 11/11/15  9:11 AM.  Always use your most recent med list.               ACCU-CHEK SOFTCLIX LANCETS lancets  Use every other day     ADVAIR DISKUS 250-50 MCG/DOSE Aepb  Generic drug:  Fluticasone-Salmeterol  Inhale into the lungs.     albuterol 108 (90 Base) MCG/ACT inhaler  Commonly known as:  PROAIR HFA  Inhale 2 puffs into the lungs every 6 (six) hours as needed.     ALPRAZolam 0.25 MG tablet  Commonly known as:  XANAX  Take 1 tablet (0.25 mg total) by mouth at bedtime as needed for anxiety.     citalopram 40 MG tablet  Commonly known as:  CELEXA  Take 1 tablet (40 mg total) by  mouth daily.     cyclobenzaprine 10 MG tablet  Commonly known as:  FLEXERIL  Take 1 tablet (10 mg total) by mouth at bedtime as needed for muscle spasms.     diclofenac 75 MG EC tablet  Commonly known as:  VOLTAREN  Take 1 tablet (75 mg total) by mouth 2 (two) times daily.     glucose blood test strip  Use every other day - Accu-Chek     lamoTRIgine 200 MG tablet  Commonly known as:  LAMICTAL  Take 1 tablet (200 mg total) by mouth daily.     lisinopril 40 MG tablet  Commonly known as:  PRINIVIL,ZESTRIL  TAKE ONE TABLET BY MOUTH ONE TIME DAILY     metFORMIN 500 MG 24 hr tablet  Commonly known as:  GLUCOPHAGE-XR  Take 1 tablet (500 mg total) by mouth daily with breakfast.        Allergies:  Allergies  Allergen Reactions  . Ciprofloxacin Anaphylaxis  . Penicillins Rash    + intradermal allergy testing to PCN/amoxicillin by allergist. Has tolerated cephalosporins.    Family History: Family History  Problem Relation Age of Onset  . Bipolar disorder Mother   . Diabetes Mother   . Hyperlipidemia Mother   . Hypertension Mother   . Glaucoma Mother   . Stroke Mother   . Coronary artery disease Mother   . Rheum arthritis Mother   . Anxiety disorder Mother   . Depression Mother   . Glaucoma Father   . Thyroid disease Father   . Cancer Paternal Aunt     pancreatic  . Cancer Paternal Grandfather     bone  . Aneurysm Sister 46    brain, deceased  . Drug abuse Sister   . Bipolar disorder Sister   . Depression Sister   . Depression Sister   . Anxiety disorder Sister   . Diabetes Sister     Social History:  reports that she has been smoking Cigars.  She started smoking about 20 years ago. She has never used smokeless tobacco. She reports that she drinks about 3.0 - 4.8 oz of alcohol per week. She reports that she does not use illicit drugs.  ROS: UROLOGY Frequent Urination?: Yes Hard to postpone urination?: No Burning/pain with urination?: No Get up at night to  urinate?: Yes Leakage of urine?: Yes Urine stream starts and stops?: No Trouble starting stream?: No Do you have to strain to urinate?: No Blood in urine?: No Urinary tract infection?: No Sexually transmitted disease?: No Injury to kidneys or bladder?: No Painful intercourse?: No Weak stream?:  No Currently pregnant?: No Vaginal bleeding?: No Last menstrual period?: n  Gastrointestinal Nausea?: No Vomiting?: No Indigestion/heartburn?: No Diarrhea?: No Constipation?: No  Constitutional Fever: No Night sweats?: No Weight loss?: Yes Fatigue?: No  Skin Skin rash/lesions?: No Itching?: No  Eyes Blurred vision?: No Double vision?: No  Ears/Nose/Throat Sore throat?: No Sinus problems?: No  Hematologic/Lymphatic Swollen glands?: No Easy bruising?: No  Cardiovascular Leg swelling?: No Chest pain?: No  Respiratory Cough?: No Shortness of breath?: No  Endocrine Excessive thirst?: Yes  Musculoskeletal Back pain?: No Joint pain?: No  Neurological Headaches?: No Dizziness?: No  Psychologic Depression?: Yes Anxiety?: Yes  Physical Exam: BP 97/66 mmHg  Pulse 87  Ht 5\' 2"  (1.575 m)  Wt 160 lb 4.8 oz (72.712 kg)  BMI 29.31 kg/m2  LMP 11/11/2015  Constitutional:  Alert and oriented, No acute distress. HEENT: [place] AT, moist mucus membranes.  Trachea midline, no masses. Respiratory: Normal respiratory effort, no increased work of breathing. GI: Abdomen is soft, nontender, nondistended, no abdominal masses GU: No CVA tenderness. Skin: No rashes, bruises or suspicious lesions. Neurologic: Grossly intact, no focal deficits, moving all 4 extremities. Psychiatric: Normal mood and affect.  Laboratory Data: Urinalysis Results for orders placed or performed in visit on 11/11/15  Microscopic Examination  Result Value Ref Range   WBC, UA 0-5 0 -  5 /hpf   RBC, UA 0-2 0 -  2 /hpf   Epithelial Cells (non renal) 0-10 0 - 10 /hpf   Casts Present (A) None seen /lpf    Cast Type Hyaline casts N/A   Mucus, UA Present (A) Not Estab.   Bacteria, UA Few (A) None seen/Few  Urinalysis, Complete  Result Value Ref Range   Specific Gravity, UA 1.020 1.005 - 1.030   pH, UA 5.5 5.0 - 7.5   Color, UA Yellow Yellow   Appearance Ur Clear Clear   Leukocytes, UA Negative Negative   Protein, UA Negative Negative/Trace   Glucose, UA 3+ (A) Negative   Ketones, UA 1+ (A) Negative   RBC, UA 3+ (A) Negative   Bilirubin, UA Negative Negative   Urobilinogen, Ur 0.2 0.2 - 1.0 mg/dL   Nitrite, UA Negative Negative   Microscopic Examination See below:   Bladder Scan (Post Void Residual) in office  Result Value Ref Range   Scan Result 20      Pertinent Imaging: Results for Alphonzo LemmingsEEBLES, Addalynn R (MRN [id number]) as of 11/11/2015 09:00  Ref. Range 11/11/2015 08:54  Scan Result Unknown 20   CLINICAL DATA: Right flank pain and fevers. Recurrent urinary tract infections.  EXAM: CT ABDOMEN AND PELVIS WITH CONTRAST  TECHNIQUE: Multidetector CT imaging of the abdomen and pelvis was performed using the standard protocol following bolus administration of intravenous contrast.  CONTRAST: 100mL OMNIPAQUE IOHEXOL 300 MG/ML SOLN  COMPARISON: None.  FINDINGS: Lower chest: No acute findings.  Hepatobiliary: No masses or other significant abnormality.  Gallbladder is unremarkable.  Pancreas: No mass, inflammatory changes, or other significant abnormality.  Spleen: Within normal limits in size and appearance.  Adrenals/Urinary Tract: No masses identified. Left kidney is normal in appearance for except for a tiny sub-cm cyst and a 2 mm calculus in lower pole.  The right kidney shows renal parenchymal scarring and atrophy. A few ill-defined area of decreased parenchymal contrast enhancement are seen in the right renal parenchyma, suspicious for pyelonephritis. No evidence of renal abscess, mass or ureteral calculi. No evidence of perinephric fluid or  abscess. Bladder is nearly completely empty.  Stomach/Bowel: No evidence of obstruction, inflammatory process, or abnormal fluid collections.  Vascular/Lymphatic: No pathologically enlarged lymph nodes. No evidence of abdominal aortic aneurysm.  Reproductive: Retroflexed uterus is seen containing several uterine fibroids, largest measure approximately 3.5 cm. Adnexal regions are unremarkable.  Other: None.  Musculoskeletal: No suspicious bone lesions identified.  IMPRESSION: Probable mild right pyelonephritis, without evidence of renal abscess, mass, or hydronephrosis. Chronic right renal parenchymal scarring and atrophy also noted.  Tiny nonobstructive left renal calculus. No evidence of ureteral calculi or hydronephrosis.  Small uterine fibroids.   Electronically Signed  By: Myles Rosenthal M.D.  On: 05/21/2015 16:12      Result Notes     Assessment & Plan:    1. Recurrent UTI- Patient is catheterized today for a specimen to send for culture.  I will hold prescribe an antibiotic at this time until urine culture results and sensitivities are available.  Patient advised to seek immediate medical attention for fevers over 101, severe flank pain, nausea or uncontrolled vomiting.   - Urinalysis, Complete - Urine culture - BLADDER SCAN AMB NON-IMAGING  2. Right renal atrophy:   I will speak to the physicians regarding the findings of the right renal cortical scarring and atrophy to see if further studies are warranted.   Return for pending urine culture.  These notes generated with voice recognition software. I apologize for typographical errors.  Cloretta Ned  [facility] [address], [address] [place], [place] [address] [phone]  Addendum:   After discussing the case with Dr. Apolinar Junes and reviewing her CT from 04/2015, it was decided that a renal ultrasound and renal lasix scan should be obtained to monitor the  hydronephrosis and kidney function.

## 2015-11-11 NOTE — Progress Notes (Signed)
In and Out Catheterization  Patient is present today for a I & O catheterization due to cystitis. Patient was cleaned and prepped in a sterile fashion with betadine and Lidocaine 2% jelly was instilled into the urethra.  A 14FR cath was inserted no complications were noted , 40ml of urine return was noted, urine was clear and yellow in color. A clean urine sample was collected for Urinalysis. Bladder was drained and catheter was removed with out difficulty.    Preformed by: Dallas Schimkeamona Williams CMA

## 2015-11-13 ENCOUNTER — Encounter: Payer: Self-pay | Admitting: Family Medicine

## 2015-11-13 DIAGNOSIS — F3170 Bipolar disorder, currently in remission, most recent episode unspecified: Secondary | ICD-10-CM

## 2015-11-13 DIAGNOSIS — R202 Paresthesia of skin: Secondary | ICD-10-CM

## 2015-11-13 DIAGNOSIS — E559 Vitamin D deficiency, unspecified: Secondary | ICD-10-CM

## 2015-11-13 DIAGNOSIS — E785 Hyperlipidemia, unspecified: Secondary | ICD-10-CM

## 2015-11-13 DIAGNOSIS — L659 Nonscarring hair loss, unspecified: Secondary | ICD-10-CM

## 2015-11-13 DIAGNOSIS — G43909 Migraine, unspecified, not intractable, without status migrainosus: Secondary | ICD-10-CM

## 2015-11-13 DIAGNOSIS — E119 Type 2 diabetes mellitus without complications: Secondary | ICD-10-CM

## 2015-11-13 DIAGNOSIS — N261 Atrophy of kidney (terminal): Secondary | ICD-10-CM

## 2015-11-13 DIAGNOSIS — I10 Essential (primary) hypertension: Secondary | ICD-10-CM

## 2015-11-13 LAB — CULTURE, URINE COMPREHENSIVE

## 2015-11-14 ENCOUNTER — Other Ambulatory Visit: Payer: Self-pay | Admitting: Urology

## 2015-11-14 DIAGNOSIS — N1330 Unspecified hydronephrosis: Secondary | ICD-10-CM

## 2015-11-17 ENCOUNTER — Other Ambulatory Visit: Payer: Self-pay | Admitting: Family Medicine

## 2015-11-17 DIAGNOSIS — E559 Vitamin D deficiency, unspecified: Secondary | ICD-10-CM

## 2015-11-17 DIAGNOSIS — E785 Hyperlipidemia, unspecified: Secondary | ICD-10-CM

## 2015-11-17 DIAGNOSIS — R202 Paresthesia of skin: Secondary | ICD-10-CM

## 2015-11-17 DIAGNOSIS — E119 Type 2 diabetes mellitus without complications: Secondary | ICD-10-CM

## 2015-11-18 ENCOUNTER — Other Ambulatory Visit (INDEPENDENT_AMBULATORY_CARE_PROVIDER_SITE_OTHER): Payer: Medicare Other

## 2015-11-18 ENCOUNTER — Encounter: Payer: Self-pay | Admitting: Family Medicine

## 2015-11-18 DIAGNOSIS — R202 Paresthesia of skin: Secondary | ICD-10-CM | POA: Diagnosis not present

## 2015-11-18 DIAGNOSIS — E119 Type 2 diabetes mellitus without complications: Secondary | ICD-10-CM | POA: Diagnosis not present

## 2015-11-18 DIAGNOSIS — E559 Vitamin D deficiency, unspecified: Secondary | ICD-10-CM

## 2015-11-18 DIAGNOSIS — N261 Atrophy of kidney (terminal): Secondary | ICD-10-CM | POA: Diagnosis not present

## 2015-11-18 DIAGNOSIS — G43909 Migraine, unspecified, not intractable, without status migrainosus: Secondary | ICD-10-CM

## 2015-11-18 DIAGNOSIS — E785 Hyperlipidemia, unspecified: Secondary | ICD-10-CM | POA: Diagnosis not present

## 2015-11-18 DIAGNOSIS — L659 Nonscarring hair loss, unspecified: Secondary | ICD-10-CM

## 2015-11-18 LAB — BASIC METABOLIC PANEL
BUN: 13 mg/dL (ref 6–23)
CALCIUM: 9.6 mg/dL (ref 8.4–10.5)
CO2: 25 mEq/L (ref 19–32)
CREATININE: 0.79 mg/dL (ref 0.40–1.20)
Chloride: 102 mEq/L (ref 96–112)
GFR: 101.92 mL/min (ref >60.00)
Glucose, Bld: 291 mg/dL — ABNORMAL HIGH (ref 70–99)
Potassium: 4.4 mEq/L (ref 3.5–5.1)
Sodium: 134 mEq/L — ABNORMAL LOW (ref 135–145)

## 2015-11-18 LAB — IBC PANEL
IRON: 108 ug/dL (ref 42–145)
SATURATION RATIOS: 32.7 % (ref 20.0–50.0)
TRANSFERRIN: 236 mg/dL (ref 212.0–360.0)

## 2015-11-18 LAB — LIPID PANEL
CHOL/HDL RATIO: 5
Cholesterol: 195 mg/dL (ref 0–200)
HDL: 38.2 mg/dL — AB (ref >39.00)
LDL CALC: 130 mg/dL — AB (ref 0–99)
NONHDL: 157.22
Triglycerides: 137 mg/dL (ref 0.0–149.0)
VLDL: 27.4 mg/dL (ref 0.0–40.0)

## 2015-11-18 LAB — T4, FREE: Free T4: 0.81 ng/dL (ref 0.60–1.60)

## 2015-11-18 LAB — VITAMIN B12: VITAMIN B 12: 718 pg/mL (ref 211–911)

## 2015-11-18 LAB — VITAMIN D 25 HYDROXY (VIT D DEFICIENCY, FRACTURES): VITD: 27.16 ng/mL — AB (ref 30.00–100.00)

## 2015-11-18 LAB — MAGNESIUM: Magnesium: 1.5 mg/dL (ref 1.5–2.5)

## 2015-11-18 LAB — TSH: TSH: 0.96 u[IU]/mL (ref 0.35–4.50)

## 2015-11-18 MED ORDER — METFORMIN HCL ER 500 MG PO TB24: 1500.0000 mg | ORAL_TABLET | Freq: Every day | ORAL | Status: DC

## 2015-11-19 LAB — T3: T3, Total: 61 ng/dL — ABNORMAL LOW (ref 76–181)

## 2015-11-22 LAB — ZINC: Zinc: 70 ug/dL (ref 60–130)

## 2015-11-27 ENCOUNTER — Encounter: Payer: Self-pay | Admitting: Family Medicine

## 2015-11-27 ENCOUNTER — Ambulatory Visit (INDEPENDENT_AMBULATORY_CARE_PROVIDER_SITE_OTHER): Payer: Medicare Other | Admitting: Family Medicine

## 2015-11-27 VITALS — BP 106/72 | HR 84 | Temp 98.4°F | Ht 62.0 in | Wt 165.8 lb

## 2015-11-27 DIAGNOSIS — E1165 Type 2 diabetes mellitus with hyperglycemia: Secondary | ICD-10-CM | POA: Diagnosis not present

## 2015-11-27 DIAGNOSIS — E785 Hyperlipidemia, unspecified: Secondary | ICD-10-CM | POA: Diagnosis not present

## 2015-11-27 DIAGNOSIS — I10 Essential (primary) hypertension: Secondary | ICD-10-CM

## 2015-11-27 DIAGNOSIS — N261 Atrophy of kidney (terminal): Secondary | ICD-10-CM

## 2015-11-27 DIAGNOSIS — F3170 Bipolar disorder, currently in remission, most recent episode unspecified: Secondary | ICD-10-CM

## 2015-11-27 DIAGNOSIS — M26621 Arthralgia of right temporomandibular joint: Secondary | ICD-10-CM

## 2015-11-27 DIAGNOSIS — M26629 Arthralgia of temporomandibular joint, unspecified side: Secondary | ICD-10-CM | POA: Insufficient documentation

## 2015-11-27 DIAGNOSIS — IMO0001 Reserved for inherently not codable concepts without codable children: Secondary | ICD-10-CM

## 2015-11-27 DIAGNOSIS — Z0000 Encounter for general adult medical examination without abnormal findings: Secondary | ICD-10-CM

## 2015-11-27 DIAGNOSIS — E559 Vitamin D deficiency, unspecified: Secondary | ICD-10-CM

## 2015-11-27 DIAGNOSIS — G4733 Obstructive sleep apnea (adult) (pediatric): Secondary | ICD-10-CM

## 2015-11-27 MED ORDER — METFORMIN HCL ER 500 MG PO TB24: ORAL_TABLET | ORAL | Status: AC

## 2015-11-27 MED ORDER — VITAMIN D3 25 MCG (1000 UT) PO CAPS: 1.0000 | ORAL_CAPSULE | Freq: Every day | ORAL | Status: AC

## 2015-11-27 NOTE — Assessment & Plan Note (Signed)
Chronic, stable. Continue current regimen. 

## 2015-11-27 NOTE — Assessment & Plan Note (Signed)
Appreciate psych care of patient. Pt currently off lamictal.

## 2015-11-27 NOTE — Patient Instructions (Addendum)
Increase metformin XR to 1 tablet with breakfast, 1 with lunch, 2 with dinner.  Call [facility] to schedule repeat screening mammogram. You are doing well today, return in 3 months for diabetes follow up Try flexeril and exercises for TMJ on right.   Health Maintenance, Female Adopting a healthy lifestyle and getting preventive care can go a long way to promote health and wellness. Talk with your health care provider about what schedule of regular examinations is right for you. This is a good chance for you to check in with your provider about disease prevention and staying healthy. In between checkups, there are plenty of things you can do on your own. Experts have done a lot of research about which lifestyle changes and preventive measures are most likely to keep you healthy. Ask your health care provider for more information. WEIGHT AND DIET  Eat a healthy diet  Be sure to include plenty of vegetables, fruits, low-fat dairy products, and lean protein.  Do not eat a lot of foods high in solid fats, added sugars, or salt.  Get regular exercise. This is one of the most important things you can do for your health.  Most adults should exercise for at least 150 minutes each week. The exercise should increase your heart rate and make you sweat (moderate-intensity exercise).  Most adults should also do strengthening exercises at least twice a week. This is in addition to the moderate-intensity exercise.  Maintain a healthy weight  Body mass index (BMI) is a measurement that can be used to identify possible weight problems. It estimates body fat based on height and weight. Your health care provider can help determine your BMI and help you achieve or maintain a healthy weight.  For females 64 years of age and older:   A BMI below 18.5 is considered underweight.  A BMI of 18.5 to 24.9 is normal.  A BMI of 25 to 29.9 is considered overweight.  A BMI of 30 and above is considered  obese.  Watch levels of cholesterol and blood lipids  You should start having your blood tested for lipids and cholesterol at 44 years of age, then have this test every 5 years.  You may need to have your cholesterol levels checked more often if:  Your lipid or cholesterol levels are high.  You are older than 44 years of age.  You are at high risk for heart disease.  CANCER SCREENING   Lung Cancer  Lung cancer screening is recommended for adults 78-96 years old who are at high risk for lung cancer because of a history of smoking.  A yearly low-dose CT scan of the lungs is recommended for people who:  Currently smoke.  Have quit within the past 15 years.  Have at least a 30-pack-year history of smoking. A pack year is smoking an average of one pack of cigarettes a day for 1 year.  Yearly screening should continue until it has been 15 years since you quit.  Yearly screening should stop if you develop a health problem that would prevent you from having lung cancer treatment.  Breast Cancer  Practice breast self-awareness. This means understanding how your breasts normally appear and feel.  It also means doing regular breast self-exams. Let your health care provider know about any changes, no matter how small.  If you are in your 20s or 30s, you should have a clinical breast exam (CBE) by a health care provider every 1-3 years as part of  a regular health exam.  If you are 40 or older, have a CBE every year. Also consider having a breast X-ray (mammogram) every year.  If you have a family history of breast cancer, talk to your health care provider about genetic screening.  If you are at high risk for breast cancer, talk to your health care provider about having an MRI and a mammogram every year.  Breast cancer gene (BRCA) assessment is recommended for women who have family members with BRCA-related cancers. BRCA-related cancers  include:  Breast.  Ovarian.  Tubal.  Peritoneal cancers.  Results of the assessment will determine the need for genetic counseling and BRCA1 and BRCA2 testing. Cervical Cancer Your health care provider may recommend that you be screened regularly for cancer of the pelvic organs (ovaries, uterus, and vagina). This screening involves a pelvic examination, including checking for microscopic changes to the surface of your cervix (Pap test). You may be encouraged to have this screening done every 3 years, beginning at age 21.  For women ages 30-65, health care providers may recommend pelvic exams and Pap testing every 3 years, or they may recommend the Pap and pelvic exam, combined with testing for human papilloma virus (HPV), every 5 years. Some types of HPV increase your risk of cervical cancer. Testing for HPV may also be done on women of any age with unclear Pap test results.  Other health care providers may not recommend any screening for nonpregnant women who are considered low risk for pelvic cancer and who do not have symptoms. Ask your health care provider if a screening pelvic exam is right for you.  If you have had past treatment for cervical cancer or a condition that could lead to cancer, you need Pap tests and screening for cancer for at least 20 years after your treatment. If Pap tests have been discontinued, your risk factors (such as having a new sexual partner) need to be reassessed to determine if screening should resume. Some women have medical problems that increase the chance of getting cervical cancer. In these cases, your health care provider may recommend more frequent screening and Pap tests. Colorectal Cancer  This type of cancer can be detected and often prevented.  Routine colorectal cancer screening usually begins at 44 years of age and continues through 44 years of age.  Your health care provider may recommend screening at an earlier age if you have risk factors for  colon cancer.  Your health care provider may also recommend using home test kits to check for hidden blood in the stool.  A small camera at the end of a tube can be used to examine your colon directly (sigmoidoscopy or colonoscopy). This is done to check for the earliest forms of colorectal cancer.  Routine screening usually begins at age 50.  Direct examination of the colon should be repeated every 5-10 years through 44 years of age. However, you may need to be screened more often if early forms of precancerous polyps or small growths are found. Skin Cancer  Check your skin from head to toe regularly.  Tell your health care provider about any new moles or changes in moles, especially if there is a change in a mole's shape or color.  Also tell your health care provider if you have a mole that is larger than the size of a pencil eraser.  Always use sunscreen. Apply sunscreen liberally and repeatedly throughout the day.  Protect yourself by wearing long sleeves, pants, a wide-brimmed   hat, and sunglasses whenever you are outside. HEART DISEASE, DIABETES, AND HIGH BLOOD PRESSURE   High blood pressure causes heart disease and increases the risk of stroke. High blood pressure is more likely to develop in:  People who have blood pressure in the high end of the normal range (130-139/85-89 mm Hg).  People who are overweight or obese.  People who are African American.  If you are 30-44 years of age, have your blood pressure checked every 3-5 years. If you are 37 years of age or older, have your blood pressure checked every year. You should have your blood pressure measured twice--once when you are at a hospital or clinic, and once when you are not at a hospital or clinic. Record the average of the two measurements. To check your blood pressure when you are not at a hospital or clinic, you can use:  An automated blood pressure machine at a pharmacy.  A home blood pressure monitor.  If you  are between 72 years and 87 years old, ask your health care provider if you should take aspirin to prevent strokes.  Have regular diabetes screenings. This involves taking a blood sample to check your fasting blood sugar level.  If you are at a normal weight and have a low risk for diabetes, have this test once every three years after 44 years of age.  If you are overweight and have a high risk for diabetes, consider being tested at a younger age or more often. PREVENTING INFECTION  Hepatitis B  If you have a higher risk for hepatitis B, you should be screened for this virus. You are considered at high risk for hepatitis B if:  You were born in a country where hepatitis B is common. Ask your health care provider which countries are considered high risk.  Your parents were born in a high-risk country, and you have not been immunized against hepatitis B (hepatitis B vaccine).  You have HIV or AIDS.  You use needles to inject street drugs.  You live with someone who has hepatitis B.  You have had sex with someone who has hepatitis B.  You get hemodialysis treatment.  You take certain medicines for conditions, including cancer, organ transplantation, and autoimmune conditions. Hepatitis C  Blood testing is recommended for:  Everyone born from [birth year] through 1964.  Anyone with known risk factors for hepatitis C. Sexually transmitted infections (STIs)  You should be screened for sexually transmitted infections (STIs) including gonorrhea and chlamydia if:  You are sexually active and are younger than 44 years of age.  You are older than 44 years of age and your health care provider tells you that you are at risk for this type of infection.  Your sexual activity has changed since you were last screened and you are at an increased risk for chlamydia or gonorrhea. Ask your health care provider if you are at risk.  If you do not have HIV, but are at risk, it may be recommended that you  take a prescription medicine daily to prevent HIV infection. This is called pre-exposure prophylaxis (PrEP). You are considered at risk if:  You are sexually active and do not regularly use condoms or know the HIV status of your partner(s).  You take drugs by injection.  You are sexually active with a partner who has HIV. Talk with your health care provider about whether you are at high risk of being infected with HIV. If you choose to begin PrEP, you  should first be tested for HIV. You should then be tested every 3 months for as long as you are taking PrEP.  PREGNANCY   If you are premenopausal and you may become pregnant, ask your health care provider about preconception counseling.  If you may become pregnant, take 400 to 800 micrograms (mcg) of folic acid every day.  If you want to prevent pregnancy, talk to your health care provider about birth control (contraception). OSTEOPOROSIS AND MENOPAUSE   Osteoporosis is a disease in which the bones lose minerals and strength with aging. This can result in serious bone fractures. Your risk for osteoporosis can be identified using a bone density scan.  If you are 65 years of age or older, or if you are at risk for osteoporosis and fractures, ask your health care provider if you should be screened.  Ask your health care provider whether you should take a calcium or vitamin D supplement to lower your risk for osteoporosis.  Menopause may have certain physical symptoms and risks.  Hormone replacement therapy may reduce some of these symptoms and risks. Talk to your health care provider about whether hormone replacement therapy is right for you.  HOME CARE INSTRUCTIONS   Schedule regular health, dental, and eye exams.  Stay current with your immunizations.   Do not use any tobacco products including cigarettes, chewing tobacco, or electronic cigarettes.  If you are pregnant, do not drink alcohol.  If you are breastfeeding, limit how  much and how often you drink alcohol.  Limit alcohol intake to no more than 1 drink per day for nonpregnant women. One drink equals 12 ounces of beer, 5 ounces of wine, or 1 ounces of hard liquor.  Do not use street drugs.  Do not share needles.  Ask your health care provider for help if you need support or information about quitting drugs.  Tell your health care provider if you often feel depressed.  Tell your health care provider if you have ever been abused or do not feel safe at home.   This information is not intended to replace advice given to you by your health care provider. Make sure you discuss any questions you have with your health care provider.   Document Released: 01/26/2011 Document Revised: 08/03/2014 Document Reviewed: 06/14/2013 Elsevier Interactive Patient Education 2016 [employer]  

## 2015-11-27 NOTE — Assessment & Plan Note (Signed)
Chronic, stable off statin.  

## 2015-11-27 NOTE — Assessment & Plan Note (Signed)
Pending renal studies by urology. Appreciate their care of patient.

## 2015-11-27 NOTE — Assessment & Plan Note (Signed)
Advised restart 1000 IU daily.

## 2015-11-27 NOTE — Progress Notes (Signed)
Pre visit review using our clinic review tool, if applicable. No additional management support is needed unless otherwise documented below in the visit note. 

## 2015-11-27 NOTE — Assessment & Plan Note (Addendum)
Sugars slowly trending down but still too high - will increase metformin to total 2000 ER daily. RTC 3 mo f/u visit

## 2015-11-27 NOTE — Assessment & Plan Note (Signed)
Restarted CPAP. Appreciate pulm care of patient.

## 2015-11-27 NOTE — Assessment & Plan Note (Signed)
Preventative protocols reviewed and updated unless pt declined. Discussed healthy diet and lifestyle.  

## 2015-11-27 NOTE — Progress Notes (Signed)
BP 106/72 mmHg  Pulse 84  Temp(Src) 98.4 F (36.9 C) (Oral)  Ht 5\' 2"  (1.575 m)  Wt 165 lb 12 oz (75.184 kg)  BMI 30.31 kg/m2  LMP 11/11/2015   CC: CPE  Subjective:    Patient ID: Allison Schmitt, female    DOB: 1971-02-16, 45 y.o.   MRN: [id number]  HPI: Allison Schmitt is a 45 y.o. female presenting on 11/27/2015 for Annual Exam   Recurrent UTI with right renal atrophy - pending renal US and renal lasix scan.   Ongoing R jaw pain. Worse with chewing. Slowly improving. Treating with ibuprofen.   Recent dx uncontrolled DM with A1c 11.5%. Prior diet controlled. Metformin started 11/05/2015, declined nutritionist. She is checking sugars - slowly improving fasting this morning 172. No low sugars. Tolerating metformin ok.  Lab Results  Component Value Date   HGBA1C 11.5* 11/05/2015     Bipolar affective disorder - followed by Dr Garnetta BuddyFaheem. Started on Lamotrigine 200mg  and xanax 0.25mg  at bedtime, continued celexa 40mg  daily. Pt stopped lamictal  Not really using xanax.   OSA - started on CPAP 11 (Ramchandran). Ongoing sleep initiation troubles with CPAP.   Preventative: Pap smear normal 10/2014, rpt 3 yrs. H/o uterine ablation and BTL.  Mammogram 02/2013 normal.will call to reschedule Flu shot yearly Pneumovax 2008.  Tdap 2008.  Seat belt use discussed. Sunscreen use discussed. No changing moles on skin.  LMP 11/11/2015  Caffeine: has cut down on sodas  Lives with daughter, 1 dog  Occupation: disability from bipolar, looking for part time job  Edu: HS  Activity: some walking - not recently, some cardio at home  Diet: good water, occasional fruits/vegetables   Relevant past medical, surgical, family and social history reviewed and updated as indicated. Interim medical history since our last visit reviewed. Allergies and medications reviewed and updated. Current Outpatient Prescriptions on File Prior to Visit  Medication Sig  . ACCU-CHEK SOFTCLIX LANCETS lancets Use  every other day  . albuterol (PROAIR HFA) 108 (90 BASE) MCG/ACT inhaler Inhale 2 puffs into the lungs every 6 (six) hours as needed.  . ALPRAZolam (XANAX) 0.25 MG tablet Take 1 tablet (0.25 mg total) by mouth at bedtime as needed for anxiety.  . citalopram (CELEXA) 40 MG tablet Take 1 tablet (40 mg total) by mouth daily.  . cyclobenzaprine (FLEXERIL) 10 MG tablet Take 1 tablet (10 mg total) by mouth at bedtime as needed for muscle spasms.  . diclofenac (VOLTAREN) 75 MG EC tablet Take 1 tablet (75 mg total) by mouth 2 (two) times daily. (Patient taking differently: Take 75 mg by mouth 2 (two) times daily as needed. )  . Fluticasone-Salmeterol (ADVAIR DISKUS) 250-50 MCG/DOSE AEPB Inhale into the lungs.  Marland Kitchen. glucose blood test strip Use every other day - Accu-Chek  . lisinopril (PRINIVIL,ZESTRIL) 40 MG tablet TAKE ONE TABLET BY MOUTH ONE TIME DAILY  . lamoTRIgine (LAMICTAL) 200 MG tablet Take 1 tablet (200 mg total) by mouth daily. (Patient not taking: Reported on 11/27/2015)   No current facility-administered medications on file prior to visit.    Review of Systems  Constitutional: Negative for fever, chills, activity change, appetite change, fatigue and unexpected weight change.  HENT: Negative for hearing loss.   Eyes: Negative for visual disturbance.  Respiratory: Negative for cough, chest tightness, shortness of breath and wheezing.   Cardiovascular: Negative for chest pain, palpitations and leg swelling.  Gastrointestinal: Positive for constipation. Negative for nausea, vomiting, abdominal pain, diarrhea, blood in  stool and abdominal distention.  Genitourinary: Negative for hematuria and difficulty urinating.  Musculoskeletal: Negative for myalgias, arthralgias and neck pain.  Skin: Negative for rash.  Neurological: Positive for headaches. Negative for dizziness, seizures and syncope.  Hematological: Negative for adenopathy. Does not bruise/bleed easily.  Psychiatric/Behavioral: Negative for  dysphoric mood. The patient is not nervous/anxious.    Per HPI unless specifically indicated in ROS section     Objective:    BP 106/72 mmHg  Pulse 84  Temp(Src) 98.4 F (36.9 C) (Oral)  Ht  (1.575 m)  Wt 165 lb 12 oz (75.184 kg)  BMI 30.31 kg/m2  LMP 11/11/2015  Wt Readings from Last 3 Encounters:  11/27/15 165 lb 12 oz (75.184 kg)  11/11/15 160 lb 4.8 oz (72.712 kg)  11/05/15 163 lb (73.936 kg)    Physical Exam  Constitutional: She is oriented to person, place, and time. She appears well-developed and well-nourished. No distress.  HENT:  Head: Normocephalic and atraumatic.  Right Ear: Hearing, tympanic membrane, external ear and ear canal normal.  Left Ear: Hearing, tympanic membrane, external ear and ear canal normal.  Nose: Nose normal.  Mouth/Throat: Uvula is midline, oropharynx is clear and moist and mucous membranes are normal. No oropharyngeal exudate, posterior oropharyngeal edema or posterior oropharyngeal erythema.  Tender to palpation at R TMJ No popping or asymmetrical movement at bilat TMJs  Eyes: Conjunctivae and EOM are normal. Pupils are equal, round, and reactive to light. No scleral icterus.  Neck: Normal range of motion. Neck supple. No thyromegaly present.  Cardiovascular: Normal rate, regular rhythm, normal heart sounds and intact distal pulses.   No murmur heard. Pulses:      Radial pulses are 2+ on the right side, and 2+ on the left side.  Pulmonary/Chest: Effort normal and breath sounds normal. No respiratory distress. She has no wheezes. She has no rales.  Abdominal: Soft. Bowel sounds are normal. She exhibits no distension and no mass. There is no tenderness. There is no rebound and no guarding.  Musculoskeletal: Normal range of motion. She exhibits no edema.  Lymphadenopathy:    She has no cervical adenopathy.  Neurological: She is alert and oriented to person, place, and time.  CN grossly intact, station and gait intact  Skin: Skin is warm  and dry. No rash noted.  Psychiatric: She has a normal mood and affect. Her behavior is normal. Judgment and thought content normal.  Nursing note and vitals reviewed.  Results for orders placed or performed in visit on 11/18/15  Lipid panel  Result Value Ref Range   Cholesterol 195 0 - 200 mg/dL   Triglycerides 161.0 0.0 - 149.0 mg/dL   HDL 96.04 (L) >54.09 mg/dL   VLDL 81.1 0.0 - 91.4 mg/dL   LDL Cholesterol 782 (H) 0 - 99 mg/dL   Total CHOL/HDL Ratio 5    NonHDL 157.22   Basic metabolic panel  Result Value Ref Range   Sodium 134 (L) 135 - 145 mEq/L   Potassium 4.4 3.5 - 5.1 mEq/L   Chloride 102 96 - 112 mEq/L   CO2 25 19 - 32 mEq/L   Glucose, Bld 291 (H) 70 - 99 mg/dL   BUN 13 6 - 23 mg/dL   Creatinine, Ser 9.56 0.40 - 1.20 mg/dL   Calcium 9.6 8.4 - 21.3 mg/dL   GFR 086.57 >84.69 mL/min  VITAMIN D 25 Hydroxy (Vit-D Deficiency, Fractures)  Result Value Ref Range   VITD 27.16 (L) 30.00 - 100.00 ng/mL  TSH  Result Value Ref Range   TSH 0.96 0.35 - 4.50 uIU/mL  T4, free  Result Value Ref Range   Free T4 0.81 0.60 - 1.60 ng/dL  T3  Result Value Ref Range   T3, Total 61.0 (L) 76 - 181 ng/dL  Vitamin Z61  Result Value Ref Range   Vitamin B-12 718 211 - 911 pg/mL  IBC panel  Result Value Ref Range   Iron 108 42 - 145 ug/dL   Transferrin 096.0 454.0 - 360.0 mg/dL   Saturation Ratios 98.1 20.0 - 50.0 %  Magnesium  Result Value Ref Range   Magnesium 1.5 1.5 - 2.5 mg/dL  Zinc  Result Value Ref Range   Zinc 70 60 - 130 mcg/dL      Assessment & Plan:   Problem List Items Addressed This Visit    Bipolar affective disorder [facility])    Appreciate psych care of patient. Pt currently off lamictal.       Uncontrolled diabetes mellitus type 2 without complications (HCC)    Sugars slowly trending down but still too high - will increase metformin to total 2000 ER daily. RTC 3 mo f/u visit      Relevant Medications   metFORMIN (GLUCOPHAGE-XR) 500 MG 24 hr tablet   HTN  (hypertension)    Chronic, stable. Continue current regimen.      HLD (hyperlipidemia)    Chronic, stable off statin.      Healthcare maintenance - Primary    Preventative protocols reviewed and updated unless pt declined. Discussed healthy diet and lifestyle.       Vitamin D deficiency    Advised restart 1000 IU daily.      OSA (obstructive sleep apnea)    Restarted CPAP. Appreciate pulm care of patient.      Right renal atrophy    Pending renal studies by urology. Appreciate their care of patient.      Temporomandibular joint (TMJ) pain    ?degenerative pain. H/o remote trauma to jaw. Discussed flexeril and eccentric exercises.          Follow up plan: Return in about 3 months (around 02/27/2016), or as needed, for follow up visit.  Eustaquio Boyden, MD

## 2015-11-27 NOTE — Assessment & Plan Note (Signed)
?  degenerative pain. H/o remote trauma to jaw. Discussed flexeril and eccentric exercises.

## 2015-11-28 ENCOUNTER — Ambulatory Visit: Payer: Medicare Other

## 2015-12-02 ENCOUNTER — Ambulatory Visit (INDEPENDENT_AMBULATORY_CARE_PROVIDER_SITE_OTHER): Payer: Self-pay | Admitting: Psychiatry

## 2015-12-05 ENCOUNTER — Ambulatory Visit
Admission: RE | Admit: 2015-12-05 | Discharge: 2015-12-05 | Disposition: A | Discharge status: Home / Self Care | Payer: Medicare Other | Source: Ambulatory Visit | Attending: Urology | Admitting: Urology

## 2015-12-05 DIAGNOSIS — N1330 Unspecified hydronephrosis: Secondary | ICD-10-CM

## 2015-12-12 ENCOUNTER — Encounter
Admission: RE | Admit: 2015-12-12 | Discharge: 2015-12-12 | Disposition: A | Discharge status: Home / Self Care | Payer: Medicare Other | Source: Ambulatory Visit | Attending: Urology | Admitting: Urology

## 2015-12-12 ENCOUNTER — Ambulatory Visit
Admission: RE | Admit: 2015-12-12 | Discharge: 2015-12-12 | Disposition: A | Discharge status: Home / Self Care | Payer: Medicare Other | Source: Ambulatory Visit | Attending: Urology | Admitting: Urology

## 2015-12-12 DIAGNOSIS — N200 Calculus of kidney: Secondary | ICD-10-CM | POA: Diagnosis not present

## 2015-12-12 DIAGNOSIS — N270 Small kidney, unilateral: Secondary | ICD-10-CM | POA: Insufficient documentation

## 2015-12-12 DIAGNOSIS — N1330 Unspecified hydronephrosis: Secondary | ICD-10-CM | POA: Diagnosis not present

## 2015-12-12 MED ORDER — TECHNETIUM TC 99M MERTIATIDE
5.0000 | Freq: Once | INTRAVENOUS | Status: AC | PRN
  Administered 2015-12-12: 5.49 via INTRAVENOUS

## 2015-12-12 MED ORDER — FUROSEMIDE 10 MG/ML IJ SOLN
20.0000 mg | Freq: Once | INTRAMUSCULAR | Status: AC
  Administered 2015-12-12: 20 mg via INTRAVENOUS
  Filled 2015-12-12 (×2): qty 2

## 2015-12-19 DIAGNOSIS — G4733 Obstructive sleep apnea (adult) (pediatric): Secondary | ICD-10-CM | POA: Diagnosis not present

## 2015-12-25 ENCOUNTER — Other Ambulatory Visit: Payer: Self-pay | Admitting: Family Medicine

## 2016-01-06 DIAGNOSIS — G4733 Obstructive sleep apnea (adult) (pediatric): Secondary | ICD-10-CM | POA: Diagnosis not present

## 2016-01-19 DIAGNOSIS — G4733 Obstructive sleep apnea (adult) (pediatric): Secondary | ICD-10-CM | POA: Diagnosis not present

## 2016-02-18 DIAGNOSIS — G4733 Obstructive sleep apnea (adult) (pediatric): Secondary | ICD-10-CM | POA: Diagnosis not present

## 2016-02-20 DIAGNOSIS — G4733 Obstructive sleep apnea (adult) (pediatric): Secondary | ICD-10-CM | POA: Diagnosis not present

## 2016-02-25 ENCOUNTER — Ambulatory Visit (INDEPENDENT_AMBULATORY_CARE_PROVIDER_SITE_OTHER): Payer: Medicare Other | Admitting: Primary Care

## 2016-02-25 ENCOUNTER — Encounter: Payer: Self-pay | Admitting: Primary Care

## 2016-02-25 VITALS — BP 122/72 | HR 84 | Temp 98.1°F | Ht 62.0 in | Wt 167.4 lb

## 2016-02-25 DIAGNOSIS — A488 Other specified bacterial diseases: Secondary | ICD-10-CM | POA: Diagnosis not present

## 2016-02-25 DIAGNOSIS — R109 Unspecified abdominal pain: Secondary | ICD-10-CM

## 2016-02-25 DIAGNOSIS — N898 Other specified noninflammatory disorders of vagina: Secondary | ICD-10-CM

## 2016-02-25 LAB — POC URINALSYSI DIPSTICK (AUTOMATED)
Bilirubin, UA: NEGATIVE
Blood, UA: NEGATIVE
GLUCOSE UA: NEGATIVE
Ketones, UA: NEGATIVE
LEUKOCYTES UA: NEGATIVE
NITRITE UA: NEGATIVE
PROTEIN UA: NEGATIVE
SPEC GRAV UA: 1.02
UROBILINOGEN UA: NEGATIVE
pH, UA: 6

## 2016-02-25 NOTE — Progress Notes (Signed)
Subjective:    Patient ID: Allison Schmitt, female    DOB: Nov 06, 1970, 45 y.o.   MRN: [id number]  HPI  Allison Schmitt is a 45 year old female with a history of recurrent BV and UTI who presents today with a chief complaint of urinary frequency. She also reports right lower groin pain. Her symptoms have been present since Sunday this week. She has been under stress recently as she found out that her best friend had a stroke and has not been resting well since.   She's also noticed vaginal discharge with a foul smell. She has a history of recurrent bacterial vaginosis and was once managed on Metrogel for maintenance. She has not used her Metrogel since mid June this summer as she has been busy moving. Denies fevers, hematuria, vaginal itching, vomiting, diarrhea.  Review of Systems  Constitutional: Negative for fever.  Gastrointestinal: Negative for constipation, diarrhea and vomiting.  Genitourinary: Positive for frequency and vaginal discharge. Negative for dysuria, flank pain and hematuria.       Past Medical History:  Diagnosis Date  . Anxiety   . Arthritis    bilateral knees  . Asthma   . Bipolar disorder (HCC)   . Depression   . Headache(784.0)   . Heart murmur   . History of chicken pox   . History of kidney stones 2007   s/p lithotripsy, 2 currently, stable, has seen uro in past  . History of rheumatic fever    with residual murmur  . HLD (hyperlipidemia)   . HTN (hypertension)   . Migraines    prior on maxalt  . OSA (obstructive sleep apnea)    hx this, was on CPAP  . Sciatica    per patient  . Type 2 diabetes mellitus (HCC) 2006   Diet controlled currently, prior on metformin  . Vitamin D deficiency      Social History   Social History  . Marital status: Single    Spouse name: N/A  . Number of children: N/A  . Years of education: N/A   Occupational History  . Not on file.   Social History Main Topics  . Smoking status: Current Some Day Smoker   Types: Cigars    Start date: 05/09/1995  . Smokeless tobacco: Never Used     Comment: 1 cigar 3-4 x/week off and on for 20 years  . Alcohol use 3.0 - 4.8 oz/week    4 - 6 Glasses of wine, 1 - 2 Cans of beer per week     Comment: occasional  . Drug use: No  . Sexual activity: Yes    Partners: Male    Birth control/ protection: Condom     Comment: uterine ablation/tn   Other Topics Concern  . Not on file   Social History Narrative   Caffeine: dark sodas occasionally   Lives with daughter, 1 dog   Occupation: disability from bipolar, looking for part time job   Edu: HS   Activity: walking - not recently, some cardio at home   Diet: good water, occasional fruits/vegetables    Past Surgical History:  Procedure Laterality Date  . KIDNEY STONE SURGERY  1989   lithotripsy  . KNEE SURGERY  2009   Right  . TUBAL LIGATION  1997  . uterine ablation  2010   dysmenorrhea and menorrhagia    Family History  Problem Relation Age of Onset  . Bipolar disorder Mother   . Diabetes Mother   .  Hyperlipidemia Mother   . Hypertension Mother   . Glaucoma Mother   . Stroke Mother   . Coronary artery disease Mother   . Rheum arthritis Mother   . Anxiety disorder Mother   . Depression Mother   . Glaucoma Father   . Thyroid disease Father   . Cancer Paternal Aunt     pancreatic  . Cancer Paternal Grandfather     bone  . Aneurysm Sister 46    brain, deceased  . Drug abuse Sister   . Bipolar disorder Sister   . Depression Sister   . Depression Sister   . Anxiety disorder Sister   . Diabetes Sister     Allergies  Allergen Reactions  . Ciprofloxacin Anaphylaxis  . Penicillins Rash    + intradermal allergy testing to PCN/amoxicillin by allergist. Has tolerated cephalosporins.    Current Outpatient Prescriptions on File Prior to Visit  Medication Sig Dispense Refill  . ACCU-CHEK SOFTCLIX LANCETS lancets Use every other day 100 each prn  . albuterol (PROAIR HFA) 108 (90 BASE)  MCG/ACT inhaler Inhale 2 puffs into the lungs every 6 (six) hours as needed. 18 g 3  . ALPRAZolam (XANAX) 0.25 MG tablet Take 1 tablet (0.25 mg total) by mouth at bedtime as needed for anxiety. 30 tablet 0  . Cholecalciferol (VITAMIN D3) 1000 units CAPS Take 1 capsule (1,000 Units total) by mouth daily. 30 capsule   . citalopram (CELEXA) 40 MG tablet Take 1 tablet (40 mg total) by mouth daily. 90 tablet 2  . cyclobenzaprine (FLEXERIL) 10 MG tablet Take 1 tablet (10 mg total) by mouth at bedtime as needed for muscle spasms. 30 tablet 0  . diclofenac (VOLTAREN) 75 MG EC tablet Take 1 tablet (75 mg total) by mouth 2 (two) times daily. (Patient taking differently: Take 75 mg by mouth 2 (two) times daily as needed. ) 30 tablet 0  . Fluticasone-Salmeterol (ADVAIR DISKUS) 250-50 MCG/DOSE AEPB Inhale into the lungs.    Marland Kitchen glucose blood test strip Use every other day - Accu-Chek 50 each 6  . lisinopril (PRINIVIL,ZESTRIL) 40 MG tablet TAKE ONE TABLET BY MOUTH ONE TIME DAILY 90 tablet 2  . metFORMIN (GLUCOPHAGE-XR) 500 MG 24 hr tablet Take 1 tablet with breakfast, 1 tablet with lunch, 2 tablets with dinner 360 tablet 3  . lamoTRIgine (LAMICTAL) 200 MG tablet Take 1 tablet (200 mg total) by mouth daily. (Patient not taking: Reported on 11/27/2015) 30 tablet 1   No current facility-administered medications on file prior to visit.     BP 122/72   Pulse 84   Temp 98.1 F (36.7 C) (Oral)   Ht 5\' 2"  (1.575 m)   Wt 167 lb 6.4 oz (75.9 kg)   LMP 01/25/2016   BMI 30.62 kg/m    Objective:   Physical Exam  Constitutional: She appears well-nourished. She does not appear ill.  Cardiovascular: Normal rate and regular rhythm.   Pulmonary/Chest: Effort normal and breath sounds normal.  Abdominal: Soft. Normal appearance and bowel sounds are normal. There is no guarding, no tenderness at McBurney's point and negative Murphy's sign.  Right lower groin discomfort  Genitourinary: There is no tenderness or lesion on  the right labia. There is no tenderness or lesion on the left labia. Cervix exhibits discharge. Cervix exhibits no motion tenderness. Vaginal discharge found.  Genitourinary Comments: Mild amount of whitish vaginal discharge, no foul odor.  Skin: Skin is warm and dry.  Assessment & Plan:  Urinary Frequency/Groin Pain:  Present for 2 days, groin pain to right lower groin. She has been under stress and believes it may be attributed to that. UA: Negative. Also with vaginal and foul smelling discharge. History of recurrent BV. Wet prep sent off and pending. Pelvic exam with mild whitish vaginal discharge. Does not appear acutely ill or in distress, abdominal exam unremarkable, vital stable. Will await wet prep results. Return precautions provided.  Morrie Sheldon, NP

## 2016-02-25 NOTE — Patient Instructions (Signed)
I will notify you of the results of your vaginal swab once received.  Your urine does not show evidence of infection.  Ensure you are staying hydrated with water.  It was a pleasure meeting you!

## 2016-02-25 NOTE — Progress Notes (Signed)
Pre visit review using our clinic review tool, if applicable. No additional management support is needed unless otherwise documented below in the visit note. 

## 2016-02-26 ENCOUNTER — Other Ambulatory Visit: Payer: Self-pay | Admitting: Primary Care

## 2016-02-26 DIAGNOSIS — N760 Acute vaginitis: Principal | ICD-10-CM

## 2016-02-26 DIAGNOSIS — B9689 Other specified bacterial agents as the cause of diseases classified elsewhere: Secondary | ICD-10-CM

## 2016-02-26 LAB — WET PREP BY MOLECULAR PROBE
CANDIDA SPECIES: NEGATIVE
GARDNERELLA VAGINALIS: POSITIVE — AB
TRICHOMONAS VAG: NEGATIVE

## 2016-02-26 MED ORDER — METRONIDAZOLE 0.75 % VA GEL: 1.0000 | Freq: Two times a day (BID) | VAGINAL | 0 refills | Status: DC

## 2016-03-20 DIAGNOSIS — G4733 Obstructive sleep apnea (adult) (pediatric): Secondary | ICD-10-CM | POA: Diagnosis not present

## 2016-03-21 DIAGNOSIS — G4733 Obstructive sleep apnea (adult) (pediatric): Secondary | ICD-10-CM | POA: Diagnosis not present

## 2016-04-20 DIAGNOSIS — G4733 Obstructive sleep apnea (adult) (pediatric): Secondary | ICD-10-CM | POA: Diagnosis not present

## 2016-04-21 ENCOUNTER — Encounter: Payer: Self-pay | Admitting: Primary Care

## 2016-04-21 ENCOUNTER — Other Ambulatory Visit: Payer: Self-pay | Admitting: Primary Care

## 2016-04-21 ENCOUNTER — Ambulatory Visit (INDEPENDENT_AMBULATORY_CARE_PROVIDER_SITE_OTHER): Payer: Medicare Other | Admitting: Primary Care

## 2016-04-21 VITALS — BP 124/80 | HR 86 | Temp 98.3°F | Ht 62.0 in | Wt 164.1 lb

## 2016-04-21 DIAGNOSIS — Z113 Encounter for screening for infections with a predominantly sexual mode of transmission: Secondary | ICD-10-CM | POA: Diagnosis not present

## 2016-04-21 LAB — POC URINALSYSI DIPSTICK (AUTOMATED)
Bilirubin, UA: NEGATIVE
Blood, UA: NEGATIVE
Glucose, UA: NEGATIVE
Ketones, UA: NEGATIVE
Leukocytes, UA: NEGATIVE
Nitrite, UA: NEGATIVE
Protein, UA: NEGATIVE
Spec Grav, UA: 1.01
Urobilinogen, UA: NEGATIVE
pH, UA: 6

## 2016-04-21 NOTE — Progress Notes (Signed)
Subjective:    Patient ID: Allison Schmitt, female    DOB: Mar 20, 1971, 45 y.o.   MRN: [id number]  HPI  Ms. Franko is a 45 year old female with a history of BV and UTI who presents today with a chief complaint of vaginal odor. She also reports urinary frequency, right lower groin pain, vaginal discharge, and pelvic cramping. Her symptoms began about three weeks ago which improved and then returned 4 days ago. Her discharge is whitish. Denies fevers, vaginal sores, hematuria, flank pain. She's not taken anything OTC for her symptoms. She is also wanting STD testing. She's had unprotected intercourse with a new partner within the last 2 months. She is currently sexually active with another partner.  She was evaluated in early August for complaints of vaginal odor and discharge. She was positive for BV and treated with metronidazole.   Review of Systems  Constitutional: Positive for chills. Negative for fever.  Gastrointestinal: Negative for nausea and vomiting.  Genitourinary: Positive for frequency, pelvic pain and vaginal discharge. Negative for dysuria, flank pain, hematuria and vaginal bleeding.       Past Medical History:  Diagnosis Date  . Anxiety   . Arthritis    bilateral knees  . Asthma   . Bipolar disorder (HCC)   . Depression   . Headache(784.0)   . Heart murmur   . History of chicken pox   . History of kidney stones 2007   s/p lithotripsy, 2 currently, stable, has seen uro in past  . History of rheumatic fever    with residual murmur  . HLD (hyperlipidemia)   . HTN (hypertension)   . Migraines    prior on maxalt  . OSA (obstructive sleep apnea)    hx this, was on CPAP  . Sciatica    per patient  . Type 2 diabetes mellitus (HCC) 2006   Diet controlled currently, prior on metformin  . Vitamin D deficiency      Social History   Social History  . Marital status: Single    Spouse name: N/A  . Number of children: N/A  . Years of education: N/A    Occupational History  . Not on file.   Social History Main Topics  . Smoking status: Current Some Day Smoker    Types: Cigars    Start date: 05/09/1995  . Smokeless tobacco: Never Used     Comment: 1 cigar 3-4 x/week off and on for 20 years  . Alcohol use 3.0 - 4.8 oz/week    4 - 6 Glasses of wine, 1 - 2 Cans of beer per week     Comment: occasional  . Drug use: No  . Sexual activity: Yes    Partners: Male    Birth control/ protection: Condom     Comment: uterine ablation/tn   Other Topics Concern  . Not on file   Social History Narrative   Caffeine: dark sodas occasionally   Lives with daughter, 1 dog   Occupation: disability from bipolar, looking for part time job   Edu: HS   Activity: walking - not recently, some cardio at home   Diet: good water, occasional fruits/vegetables    Past Surgical History:  Procedure Laterality Date  . KIDNEY STONE SURGERY  1989   lithotripsy  . KNEE SURGERY  2009   Right  . TUBAL LIGATION  1997  . uterine ablation  2010   dysmenorrhea and menorrhagia    Family History  Problem Relation Age  of Onset  . Bipolar disorder Mother   . Diabetes Mother   . Hyperlipidemia Mother   . Hypertension Mother   . Glaucoma Mother   . Stroke Mother   . Coronary artery disease Mother   . Rheum arthritis Mother   . Anxiety disorder Mother   . Depression Mother   . Glaucoma Father   . Thyroid disease Father   . Cancer Paternal Aunt     pancreatic  . Cancer Paternal Grandfather     bone  . Aneurysm Sister 46    brain, deceased  . Drug abuse Sister   . Bipolar disorder Sister   . Depression Sister   . Depression Sister   . Anxiety disorder Sister   . Diabetes Sister     Allergies  Allergen Reactions  . Ciprofloxacin Anaphylaxis  . Penicillins Rash    + intradermal allergy testing to PCN/amoxicillin by allergist. Has tolerated cephalosporins.    Current Outpatient Prescriptions on File Prior to Visit  Medication Sig Dispense  Refill  . ACCU-CHEK SOFTCLIX LANCETS lancets Use every other day 100 each prn  . albuterol (PROAIR HFA) 108 (90 BASE) MCG/ACT inhaler Inhale 2 puffs into the lungs every 6 (six) hours as needed. 18 g 3  . ALPRAZolam (XANAX) 0.25 MG tablet Take 1 tablet (0.25 mg total) by mouth at bedtime as needed for anxiety. 30 tablet 0  . Cholecalciferol (VITAMIN D3) 1000 units CAPS Take 1 capsule (1,000 Units total) by mouth daily. 30 capsule   . citalopram (CELEXA) 40 MG tablet Take 1 tablet (40 mg total) by mouth daily. 90 tablet 2  . cyclobenzaprine (FLEXERIL) 10 MG tablet Take 1 tablet (10 mg total) by mouth at bedtime as needed for muscle spasms. 30 tablet 0  . Fluticasone-Salmeterol (ADVAIR DISKUS) 250-50 MCG/DOSE AEPB Inhale into the lungs.    Marland Kitchen. glucose blood test strip Use every other day - Accu-Chek 50 each 6  . lamoTRIgine (LAMICTAL) 200 MG tablet Take 1 tablet (200 mg total) by mouth daily. 30 tablet 1  . lisinopril (PRINIVIL,ZESTRIL) 40 MG tablet TAKE ONE TABLET BY MOUTH ONE TIME DAILY 90 tablet 2  . metFORMIN (GLUCOPHAGE-XR) 500 MG 24 hr tablet Take 1 tablet with breakfast, 1 tablet with lunch, 2 tablets with dinner 360 tablet 3   No current facility-administered medications on file prior to visit.     BP 124/80   Pulse 86   Temp 98.3 F (36.8 C) (Oral)   Ht 5\' 2"  (1.575 m)   Wt 164 lb 1.9 oz (74.4 kg)   LMP 03/29/2016   SpO2 99%   BMI 30.02 kg/m    Objective:   Physical Exam  Constitutional: She appears well-nourished.  Cardiovascular: Normal rate and regular rhythm.   Pulmonary/Chest: Effort normal and breath sounds normal.  Abdominal: Soft. Bowel sounds are normal.  Mild right groin tenderness  Genitourinary: There is no tenderness or lesion on the right labia. There is no tenderness or lesion on the left labia. Cervix exhibits discharge. Cervix exhibits no motion tenderness. No erythema in the vagina. No vaginal discharge found.          Assessment & Plan:  Vaginal  Odor:  Also with vaginal discharge, urinary frequency, pelvic cramping x 4 days. Pelvic exam with evidence of  UA: Negative Wet Prep and GC/Chlamydia sent off for further evaluation. HIV, Hep C, and RPR pending. Increase consumption of water, refrain from intercourse.  Will await results.  Morrie Sheldonlark,Sueo Cullen Kendal, NP

## 2016-04-21 NOTE — Progress Notes (Signed)
Pre visit review using our clinic review tool, if applicable. No additional management support is needed unless otherwise documented below in the visit note. 

## 2016-04-21 NOTE — Patient Instructions (Signed)
Complete lab work prior to leaving today.  I will notify you of your lab and vaginal specimen results once received.   Ensure you are staying hydrated with water. Refrain from intercourse until you hear from me.  It was a pleasure to see you today!

## 2016-04-21 NOTE — Addendum Note (Signed)
Addended by: Tawnya CrookSAMBATH, Lynnell Fiumara on: 04/21/2016 12:37 PM   Modules accepted: Orders

## 2016-04-22 ENCOUNTER — Other Ambulatory Visit: Payer: Self-pay | Admitting: Primary Care

## 2016-04-22 ENCOUNTER — Encounter: Payer: Self-pay | Admitting: Primary Care

## 2016-04-22 DIAGNOSIS — B9689 Other specified bacterial agents as the cause of diseases classified elsewhere: Secondary | ICD-10-CM

## 2016-04-22 DIAGNOSIS — N760 Acute vaginitis: Principal | ICD-10-CM

## 2016-04-22 LAB — WET PREP BY MOLECULAR PROBE
Candida species: NEGATIVE
Gardnerella vaginalis: POSITIVE — AB
Trichomonas vaginosis: NEGATIVE

## 2016-04-22 LAB — HIV ANTIBODY (ROUTINE TESTING W REFLEX): HIV: NONREACTIVE

## 2016-04-22 LAB — GC/CHLAMYDIA PROBE AMP
CT Probe RNA: NOT DETECTED
GC Probe RNA: NOT DETECTED

## 2016-04-22 LAB — RPR

## 2016-04-22 LAB — HEPATITIS C ANTIBODY: HCV AB: NEGATIVE

## 2016-04-22 MED ORDER — METRONIDAZOLE 0.75 % VA GEL: 1.0000 | Freq: Two times a day (BID) | VAGINAL | 0 refills | Status: DC

## 2016-04-22 MED ORDER — METRONIDAZOLE 500 MG PO TABS: 500.0000 mg | ORAL_TABLET | Freq: Two times a day (BID) | ORAL | 0 refills | Status: DC

## 2016-04-23 DIAGNOSIS — G4733 Obstructive sleep apnea (adult) (pediatric): Secondary | ICD-10-CM | POA: Diagnosis not present

## 2016-04-24 ENCOUNTER — Ambulatory Visit: Payer: Self-pay | Admitting: Primary Care

## 2016-05-07 ENCOUNTER — Ambulatory Visit (INDEPENDENT_AMBULATORY_CARE_PROVIDER_SITE_OTHER): Payer: Medicare Other | Admitting: Family Medicine

## 2016-05-07 ENCOUNTER — Encounter: Payer: Self-pay | Admitting: Family Medicine

## 2016-05-07 VITALS — BP 102/72 | HR 68 | Ht 62.0 in | Wt 163.0 lb

## 2016-05-07 DIAGNOSIS — R51 Headache: Secondary | ICD-10-CM | POA: Diagnosis not present

## 2016-05-07 DIAGNOSIS — R42 Dizziness and giddiness: Secondary | ICD-10-CM

## 2016-05-07 DIAGNOSIS — I10 Essential (primary) hypertension: Secondary | ICD-10-CM | POA: Diagnosis not present

## 2016-05-07 DIAGNOSIS — R519 Headache, unspecified: Secondary | ICD-10-CM

## 2016-05-07 DIAGNOSIS — E1165 Type 2 diabetes mellitus with hyperglycemia: Secondary | ICD-10-CM | POA: Diagnosis not present

## 2016-05-07 DIAGNOSIS — IMO0001 Reserved for inherently not codable concepts without codable children: Secondary | ICD-10-CM

## 2016-05-07 LAB — POC URINALSYSI DIPSTICK (AUTOMATED)
Bilirubin, UA: NEGATIVE
GLUCOSE UA: NEGATIVE
Ketones, UA: NEGATIVE
Leukocytes, UA: NEGATIVE
Nitrite, UA: NEGATIVE
PROTEIN UA: NEGATIVE
RBC UA: NEGATIVE
SPEC GRAV UA: 1.015
UROBILINOGEN UA: NEGATIVE
pH, UA: 6

## 2016-05-07 NOTE — Progress Notes (Signed)
Subjective:    Patient ID: Allison Schmitt, female    DOB: 06/28/1971, 45 y.o.   MRN: [id number]  HPI This is a 45 yo female who presents today with headache x 3 days on right side. Has pressure on right side of face and right jaw. 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 Today started feeling lightheaded when she was up moving around. Resolves when she is still. Chills started today. Has been checking her blood sugars and they are running 110- 160s. Stopped her metformin about 2-3 months ago because her blood sugars were doing better. Has been craving sweets. Works as a Scientist, research (medical) at [employer]. Has not been doing a good job of taking care of herself- little exercise, off and on. Has a pit bull and needs to walk him more. Has not taken any medication for her symptoms. Does not like to take medication. Has some flexeril at home that she has used in the past. She is requesting rest of week off from work.   Past Medical History:  Diagnosis Date  . Anxiety   . Arthritis    bilateral knees  . Asthma   . Bipolar disorder (HCC)   . Depression   . Headache(784.0)   . Heart murmur   . History of chicken pox   . History of kidney stones 2007   s/p lithotripsy, 2 currently, stable, has seen uro in past  . History of rheumatic fever    with residual murmur  . HLD (hyperlipidemia)   . HTN (hypertension)   . Migraines    prior on maxalt  . OSA (obstructive sleep apnea)    hx this, was on CPAP  . Sciatica    per patient  . Type 2 diabetes mellitus (HCC) 2006   Diet controlled currently, prior on metformin  . Vitamin D deficiency    Past Surgical History:  Procedure Laterality Date  . KIDNEY STONE SURGERY  1989   lithotripsy  . KNEE SURGERY  2009   Right  . TUBAL LIGATION  1997  . uterine ablation  2010   dysmenorrhea and menorrhagia   Family History  Problem Relation Age of Onset  . Bipolar disorder Mother   . Diabetes  Mother   . Hyperlipidemia Mother   . Hypertension Mother   . Glaucoma Mother   . Stroke Mother   . Coronary artery disease Mother   . Rheum arthritis Mother   . Anxiety disorder Mother   . Depression Mother   . Glaucoma Father   . Thyroid disease Father   . Cancer Paternal Aunt     pancreatic  . Cancer Paternal Grandfather     bone  . Aneurysm Sister 46    brain, deceased  . Drug abuse Sister   . Bipolar disorder Sister   . Depression Sister   . Depression Sister   . Anxiety disorder Sister   . Diabetes Sister    Social History  Substance Use Topics  . Smoking status: Current Some Day Smoker    Types: Cigars    Start date: 05/09/1995  . Smokeless tobacco: Never Used     Comment: 1 cigar 3-4 x/week off and on for 20 years  . Alcohol use 3.0 - 4.8 oz/week    4 - 6 Glasses of wine, 1 - 2 Cans of beer per week     Comment: occasional      Review  of Systems No fever or chills    Objective:   Physical Exam  Constitutional: She is oriented to person, place, and time. She appears well-developed and well-nourished. No distress.  HENT:  Head: Normocephalic and atraumatic.  Right Ear: Tympanic membrane, external ear and ear canal normal.  Left Ear: Tympanic membrane, external ear and ear canal normal.  Nose: Nose normal.  Mouth/Throat: Oropharynx is clear and moist.  Eyes: Conjunctivae are normal.  Neck: Normal range of motion. Neck supple. No spinous process tenderness and no muscular tenderness present.  Cardiovascular: Normal rate, regular rhythm and normal heart sounds.   Pulmonary/Chest: Effort normal and breath sounds normal.  Lymphadenopathy:    She has no cervical adenopathy.  Neurological: She is alert and oriented to person, place, and time.  Skin: Skin is warm and dry. She is not diaphoretic.  Psychiatric: She has a normal mood and affect. Her behavior is normal. Judgment and thought content normal.  Vitals reviewed.     BP 102/72   Pulse 68   Ht 5'  2" (1.575 m)   Wt 163 lb (73.9 kg)   LMP 03/29/2016   SpO2 98%   BMI 29.81 kg/m  Wt Readings from Last 3 Encounters:  05/07/16 163 lb (73.9 kg)  04/21/16 164 lb 1.9 oz (74.4 kg)  02/25/16 167 lb 6.4 oz (75.9 kg)       Assessment & Plan:  1. Lightheadedness - CBC with Differential/Platelet - Comprehensive metabolic panel - POCT Urinalysis Dipstick (Automated)  2. Essential hypertension - well controlled - CBC with Differential/Platelet - Comprehensive metabolic panel  3. Uncontrolled type 2 diabetes mellitus without complication, without long-term current use of insulin (HCC) - encouraged regular exercise, dietary compliance - CBC with Differential/Platelet - Comprehensive metabolic panel - Hemoglobin A1c  4. Nonintractable episodic headache, unspecified headache type - she has not tried any meds, suggested otc analgesics, can take 1/2 to 1 of her cyclobenzaprine that she has at home - POCT Urinalysis Dipstick (Automated)  - follow up to be determined by labs/symptoms  Allison Reeeborah Anessa Charley, FNP-BC  [facility], [facility]  05/07/2016 4:55 PM

## 2016-05-07 NOTE — Patient Instructions (Signed)
Please try a flexeril and some tylenol or ibuprofen for headache/neck pain  I will send you a message about your labs

## 2016-05-08 LAB — CBC WITH DIFFERENTIAL/PLATELET
BASOS ABS: 0 10*3/uL (ref 0.0–0.1)
Basophils Relative: 0.2 % (ref 0.0–3.0)
EOS ABS: 0.1 10*3/uL (ref 0.0–0.7)
Eosinophils Relative: 1.3 % (ref 0.0–5.0)
HCT: 42.4 % (ref 36.0–46.0)
Hemoglobin: 14.1 g/dL (ref 12.0–15.0)
LYMPHS ABS: 3.1 10*3/uL (ref 0.7–4.0)
Lymphocytes Relative: 37.9 % (ref 12.0–46.0)
MCHC: 33.1 g/dL (ref 30.0–36.0)
MCV: 89.5 fl (ref 78.0–100.0)
MONO ABS: 0.4 10*3/uL (ref 0.1–1.0)
Monocytes Relative: 4.4 % (ref 3.0–12.0)
NEUTROS PCT: 56.2 % (ref 43.0–77.0)
Neutro Abs: 4.5 10*3/uL (ref 1.4–7.7)
Platelets: 262 10*3/uL (ref 150.0–400.0)
RBC: 4.74 Mil/uL (ref 3.87–5.11)
RDW: 14.3 % (ref 11.5–15.5)
WBC: 8.1 10*3/uL (ref 4.0–10.5)

## 2016-05-08 LAB — COMPREHENSIVE METABOLIC PANEL
ALK PHOS: 31 U/L — AB (ref 39–117)
ALT: 10 U/L (ref 0–35)
AST: 17 U/L (ref 0–37)
Albumin: 4.9 g/dL (ref 3.5–5.2)
BUN: 10 mg/dL (ref 6–23)
CO2: 29 mEq/L (ref 19–32)
Calcium: 10.2 mg/dL (ref 8.4–10.5)
Chloride: 102 mEq/L (ref 96–112)
Creatinine, Ser: 0.87 mg/dL (ref 0.40–1.20)
GFR: 90.99 mL/min (ref >60.00)
GLUCOSE: 75 mg/dL (ref 70–99)
POTASSIUM: 4.5 meq/L (ref 3.5–5.1)
SODIUM: 137 meq/L (ref 135–145)
TOTAL PROTEIN: 8 g/dL (ref 6.0–8.3)
Total Bilirubin: 0.6 mg/dL (ref 0.2–1.2)

## 2016-05-08 LAB — HEMOGLOBIN A1C: Hgb A1c MFr Bld: 6.5 % (ref 4.6–6.5)

## 2016-05-20 DIAGNOSIS — G4733 Obstructive sleep apnea (adult) (pediatric): Secondary | ICD-10-CM | POA: Diagnosis not present

## 2016-05-23 DIAGNOSIS — G4733 Obstructive sleep apnea (adult) (pediatric): Secondary | ICD-10-CM | POA: Diagnosis not present

## 2016-06-20 DIAGNOSIS — G4733 Obstructive sleep apnea (adult) (pediatric): Secondary | ICD-10-CM | POA: Diagnosis not present

## 2016-06-22 ENCOUNTER — Telehealth: Payer: Self-pay | Admitting: Internal Medicine

## 2016-06-22 DIAGNOSIS — G4733 Obstructive sleep apnea (adult) (pediatric): Secondary | ICD-10-CM | POA: Diagnosis not present

## 2016-06-22 DIAGNOSIS — M25511 Pain in right shoulder: Secondary | ICD-10-CM | POA: Diagnosis not present

## 2016-06-22 DIAGNOSIS — S93401A Sprain of unspecified ligament of right ankle, initial encounter: Secondary | ICD-10-CM | POA: Diagnosis not present

## 2016-06-22 DIAGNOSIS — M25571 Pain in right ankle and joints of right foot: Secondary | ICD-10-CM | POA: Diagnosis not present

## 2016-06-22 DIAGNOSIS — G8929 Other chronic pain: Secondary | ICD-10-CM | POA: Diagnosis not present

## 2016-06-22 NOTE — Telephone Encounter (Signed)
LMOM for pt to return call. 

## 2016-06-22 NOTE — Telephone Encounter (Signed)
Pt informed of her last flu vaccine. Nothing further needed.

## 2016-06-22 NOTE — Telephone Encounter (Signed)
Pt calling asking if we can let her know, last time she was here she think she may have received a flu shot from us Would like to confirm this Please call back

## 2016-06-30 ENCOUNTER — Other Ambulatory Visit: Payer: Self-pay | Admitting: Primary Care

## 2016-06-30 ENCOUNTER — Encounter: Payer: Self-pay | Admitting: Family Medicine

## 2016-06-30 DIAGNOSIS — N760 Acute vaginitis: Principal | ICD-10-CM

## 2016-06-30 DIAGNOSIS — B9689 Other specified bacterial agents as the cause of diseases classified elsewhere: Secondary | ICD-10-CM

## 2016-07-02 ENCOUNTER — Other Ambulatory Visit: Payer: Self-pay | Admitting: Primary Care

## 2016-07-02 ENCOUNTER — Encounter: Payer: Self-pay | Admitting: Primary Care

## 2016-07-02 DIAGNOSIS — B9689 Other specified bacterial agents as the cause of diseases classified elsewhere: Secondary | ICD-10-CM

## 2016-07-02 DIAGNOSIS — N760 Acute vaginitis: Principal | ICD-10-CM

## 2016-07-02 MED ORDER — METRONIDAZOLE 0.75 % VA GEL: VAGINAL | 1 refills | Status: DC

## 2016-07-02 NOTE — Telephone Encounter (Signed)
Pt left v/m requesting cb at [phone] about metrogel. Left v/m requesting pt to cb to get info Mayra ReelKate Clark NP has requested.

## 2016-07-02 NOTE — Telephone Encounter (Signed)
Spoken to patient and she stated that Dr Reece AgarG has been refilling the original prescription for patient's symptoms. Patient is not on a maintenance dose.

## 2016-07-08 MED ORDER — METRONIDAZOLE 0.75 % VA GEL: 1.0000 | VAGINAL | 1 refills | Status: AC

## 2016-10-15 ENCOUNTER — Other Ambulatory Visit: Payer: Self-pay | Admitting: Psychiatry

## 2016-10-15 ENCOUNTER — Other Ambulatory Visit: Payer: Self-pay | Admitting: Family Medicine

## 2017-06-26 IMAGING — NM NM RENAL IMAG WO/W PHARM
3 series · 18 of 18 positions shown · non-contrast
Comparison: CT 05/21/2015, ultrasound 12/12/2015

CLINICAL DATA: Previous lithotripsy.  Recurrent renal infections.

EXAM:
NUCLEAR MEDICINE RENAL SCAN WITH DIURETIC ADMINISTRATION
TECHNIQUE: Radionuclide angiographic and sequential renal images were obtained
after intravenous injection of radiopharmaceutical. Imaging was
continued during slow intravenous injection of Lasix approximately
15 minutes after the start of the examination.
RADIOPHARMACEUTICALS:  5.49 mCi Nechnetium-DDm MAG3 IV

[Series 1000: lasix renal mag 3 · 7.79mm/px · 6 of 118 frames shown]
[frame 10/118]
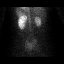
[frame 30/118]
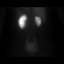
[frame 50/118]
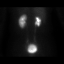
[frame 69/118]
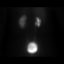
[frame 89/118]
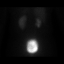
[frame 109/118]
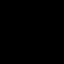

[Series 1000: lasix renal mag 3 (first dynamic renal results) · 7.79mm/px · 6 of 118 frames shown]
[frame 10/118]
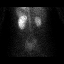
[frame 30/118]
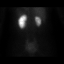
[frame 50/118]
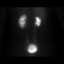
[frame 69/118]
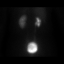
[frame 89/118]
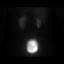
[frame 109/118]
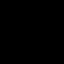

[Series 1000: lasix renal mag 3 (results) · 7.79mm/px · 6 of 118 frames shown]
[frame 10/118]
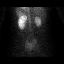
[frame 30/118]
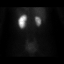
[frame 50/118]
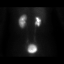
[frame 69/118]
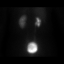
[frame 89/118]
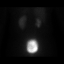
[frame 109/118]
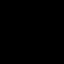

[18 of 18 positions shown; findings below may reference images not displayed]

FINDINGS: Flow:  Prompt symmetric arterial flow to the kidneys.

Left renogram: Uniform uptake of counts in the renal cortex. Counts
are promptly excreted into the collecting system and cleared prior
to administration of Lasix. Lasix augment clearance. No postvoid
residual.

Right renogram: The RIGHT kidney is smaller than the LEFT. Uniform
uptake of counts in the renal cortex. Counts are promptly excreted
into the collecting system and cleared prior to administration of
Lasix. Mild pooling of counts in the collecting system. Lasix
augment clearance. No postvoid residual.

Differential:

Left kidney = 74 %

Right kidney = 26 %

T1/2 post Lasix :

Left kidney = 5 min (counts clear significantly prior to
administration of Lasix)

Right kidney = 13 min (counts clear significantly prior to
administration of Lasix)
IMPRESSION: 1. Mild nonobstructive hydronephrosis of the RIGHT kidney.
2. Asymmetric renal size with the RIGHT kidney small on the LEFT.

## 2017-08-31 IMAGING — US US RENAL
1 series · 14 of 25 positions shown · non-contrast
Comparison: 05/21/2015 CT abdomen/ pelvis.

CLINICAL DATA: Follow-up right hydronephrosis. History of
nephrolithiasis and pyelonephritis.

EXAM:
RENAL / URINARY TRACT ULTRASOUND COMPLETE

[Series 1: us renal · 0.23mm/px · 14 of 42 slices shown]
[im 1/42]
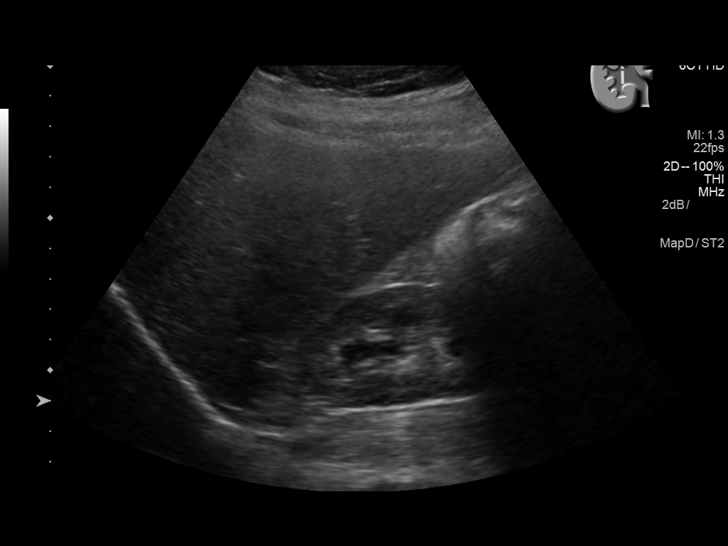
[im 4/42]
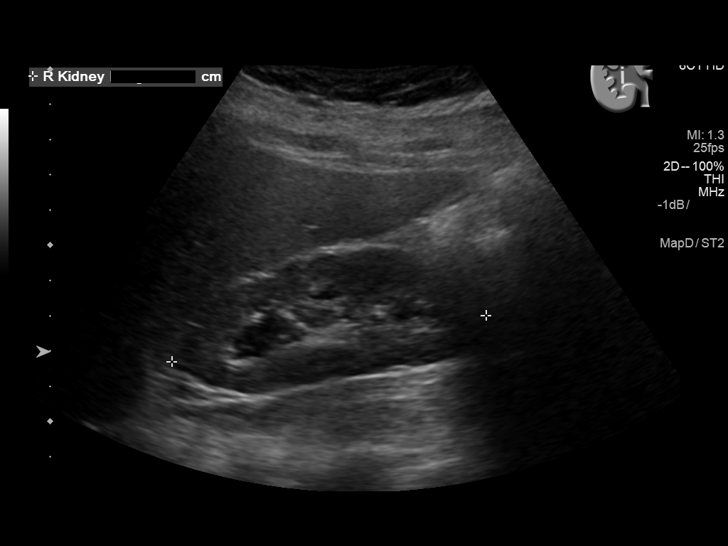
[im 7/42]
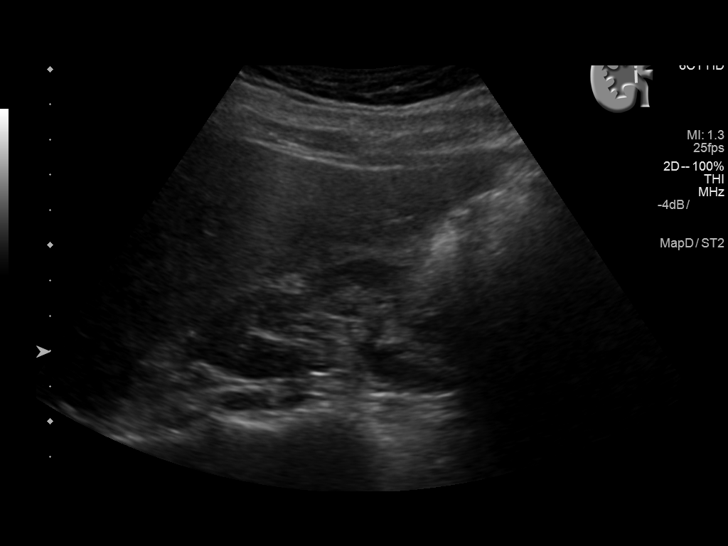
[im 11/42]
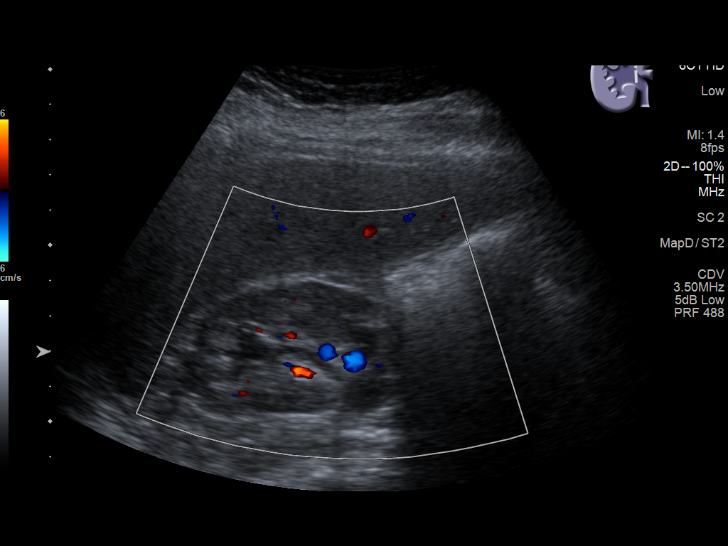
[im 14/42]
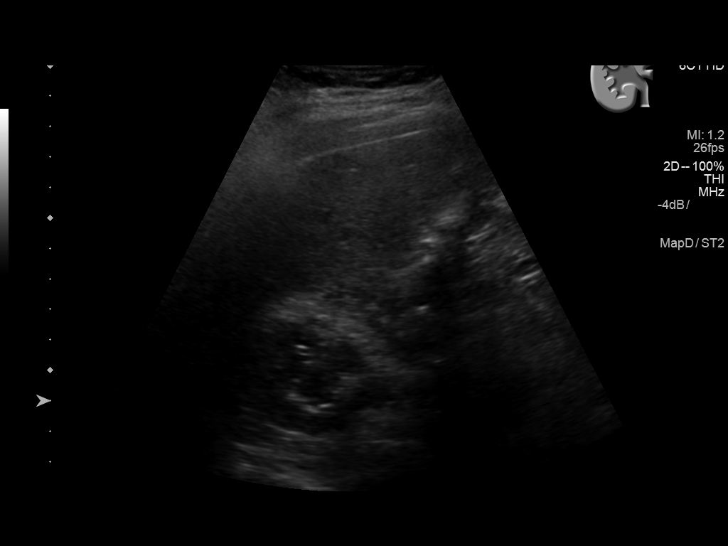
[im 16/42]
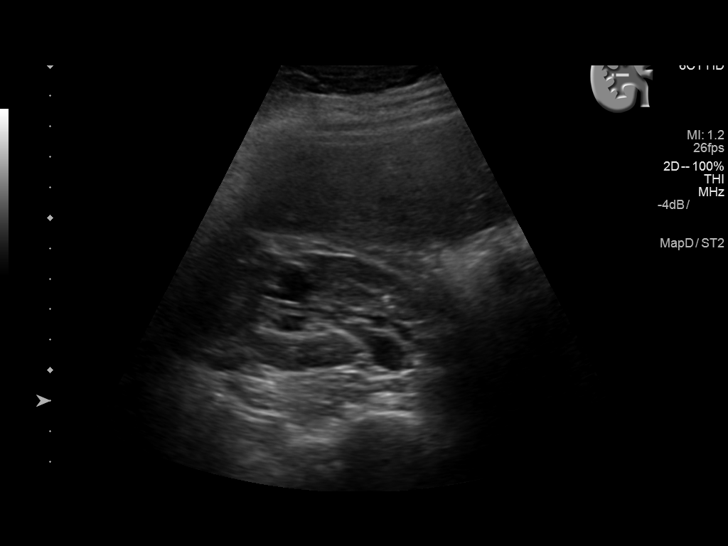
[im 19/42]
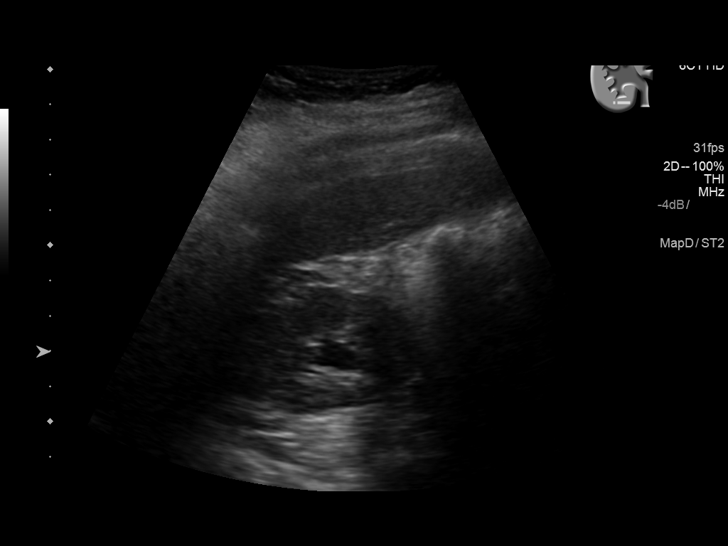
[im 23/42]
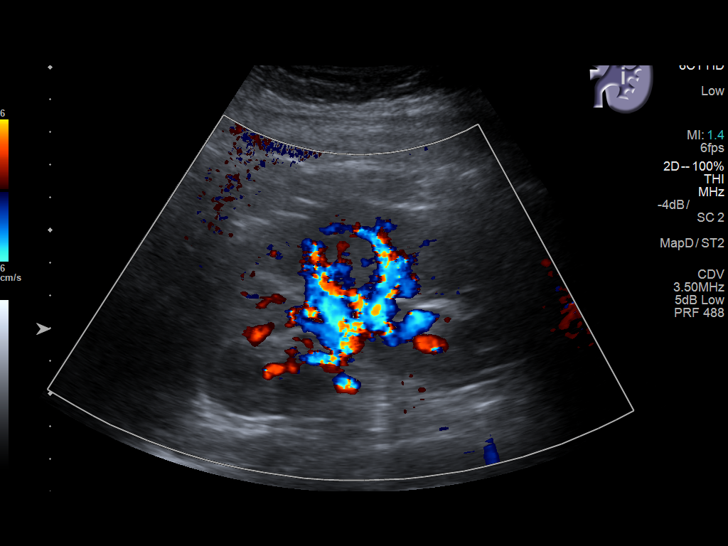
[im 26/42]
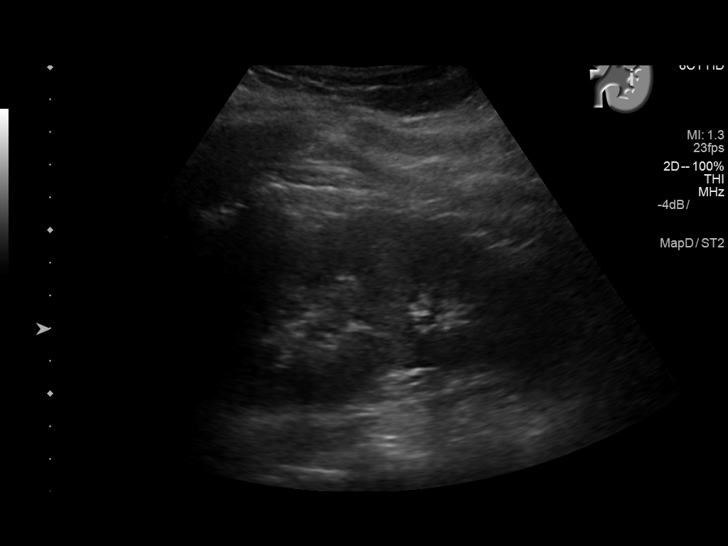
[im 28/42]
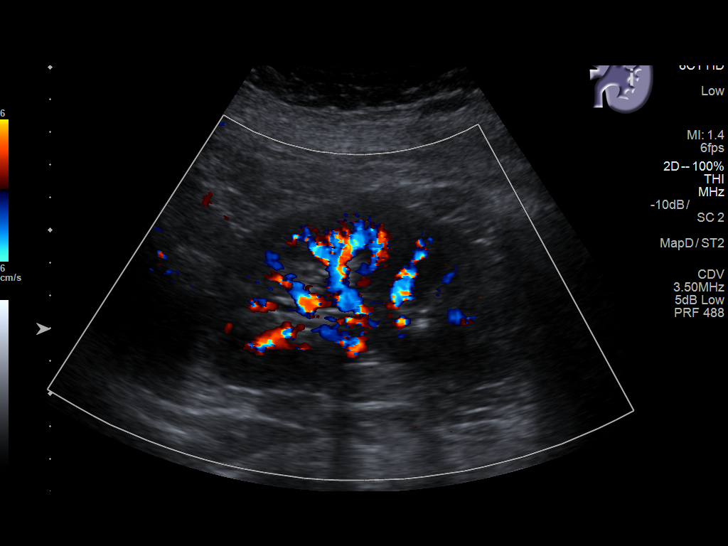
[im 31/42]
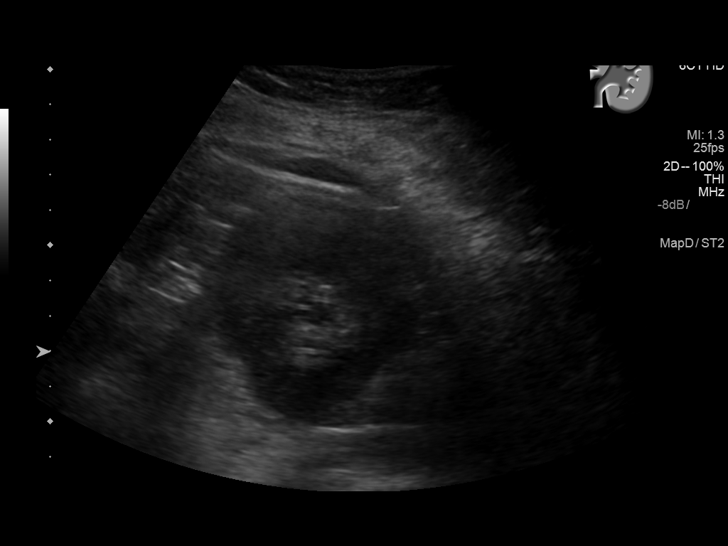
[im 35/42]
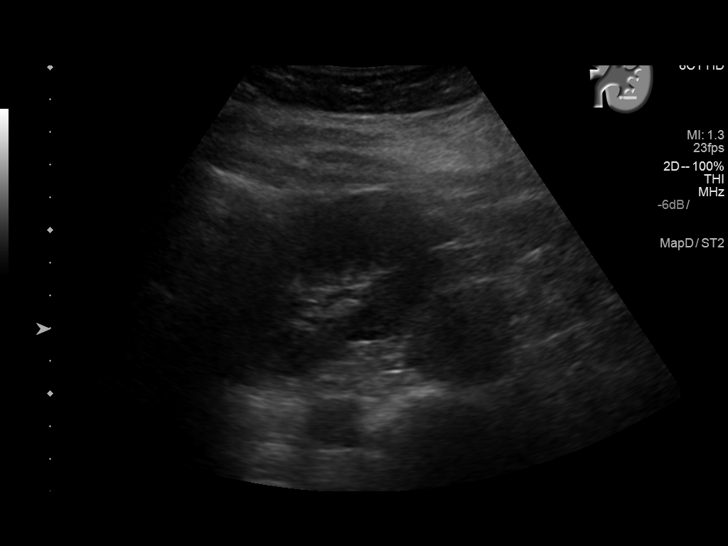
[im 38/42]
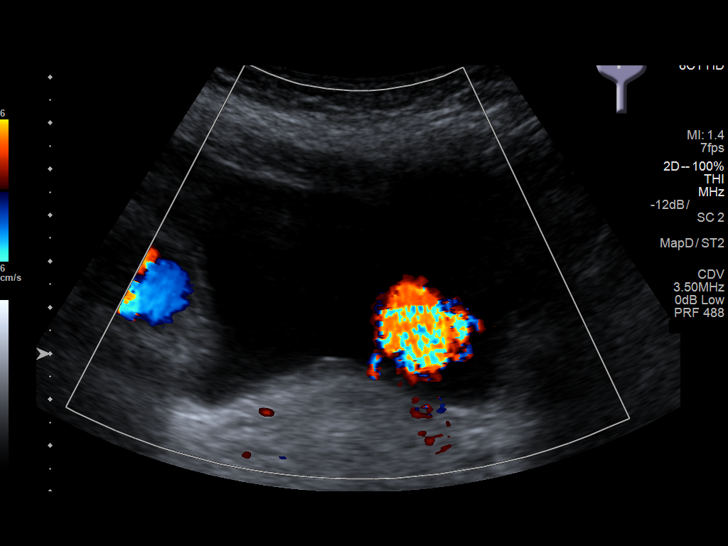
[im 42/42]
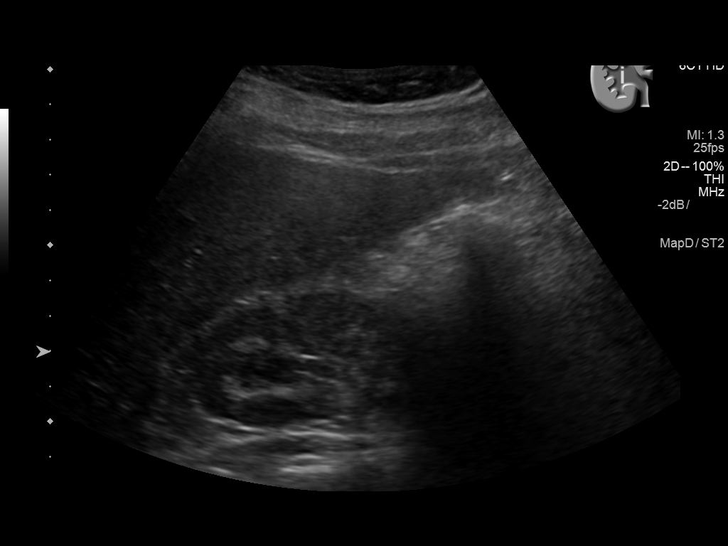

[14 of 25 positions shown; findings below may reference images not displayed]

FINDINGS: Right Kidney:

Length: 9.0 cm. Normal right renal parenchymal echogenicity with
asymmetric right renal parenchymal atrophy. No right renal mass.
Mild right hydronephrosis is not appreciably changed since
05/21/2015.

Left Kidney:

Length: 10.8 cm. Normal left renal parenchymal echogenicity. No left
hydronephrosis. Shadowing 4 mm nonobstructing stone in the lower
left kidney. No left renal mass.

Bladder:

Appears normal for degree of bladder distention. Ureteral jets are
seen bilaterally in the bladder lumen.
IMPRESSION: 1. Stable mild right hydronephrosis and asymmetric mild right renal
atrophy since 05/21/2015 CT study.
2. Nonobstructing lower left renal stone, unchanged. No left
hydronephrosis.
3. Normal bladder. Ureteral jets are demonstrated bilaterally in the
bladder.

## 2024-03-17 ENCOUNTER — Telehealth: Payer: Self-pay

## 2024-03-17 NOTE — Telephone Encounter (Signed)
 Copied from CRM #[id number]. Topic: General - Other >> Mar 17, 2024 10:39 AM Roselie BROCKS wrote: Reason for CRM: [facility] is needing to speak with the clinic, concerning lab work. Please return call at [phone]

## 2024-03-20 NOTE — Telephone Encounter (Signed)
 Ok to remove. Looks like she now sees [facility] PCP.

## 2024-03-20 NOTE — Telephone Encounter (Signed)
 Called office back to let them know we have not seen patient in 8 yrs. Office couldn't find message about why we received call.   Ok to remove you as patient PCP?
